# Patient Record
Sex: Female | Born: 1950 | Race: Black or African American | Hispanic: No | Marital: Single | Smoking: Never smoker
Health system: Southern US, Community
[De-identification: ages and names within clinical notes are randomized; demographics above are authoritative.]

## PROBLEM LIST (undated history)

## (undated) DIAGNOSIS — M1990 Unspecified osteoarthritis, unspecified site: Secondary | ICD-10-CM

## (undated) DIAGNOSIS — I10 Essential (primary) hypertension: Secondary | ICD-10-CM

## (undated) DIAGNOSIS — E785 Hyperlipidemia, unspecified: Secondary | ICD-10-CM

## (undated) DIAGNOSIS — G43909 Migraine, unspecified, not intractable, without status migrainosus: Secondary | ICD-10-CM

## (undated) DIAGNOSIS — E119 Type 2 diabetes mellitus without complications: Secondary | ICD-10-CM

## (undated) DIAGNOSIS — M810 Age-related osteoporosis without current pathological fracture: Secondary | ICD-10-CM

## (undated) HISTORY — DX: Age-related osteoporosis without current pathological fracture: M81.0

## (undated) HISTORY — DX: Unspecified osteoarthritis, unspecified site: M19.90

## (undated) HISTORY — PX: TUBAL LIGATION: SHX77

## (undated) HISTORY — DX: Migraine, unspecified, not intractable, without status migrainosus: G43.909

## (undated) HISTORY — DX: Type 2 diabetes mellitus without complications: E11.9

## (undated) HISTORY — PX: COLONOSCOPY: SHX174

## (undated) HISTORY — DX: Hyperlipidemia, unspecified: E78.5

## (undated) HISTORY — PX: KNEE ARTHROSCOPY: SUR90

## (undated) HISTORY — PX: SHOULDER ARTHROSCOPY: SHX128

## (undated) HISTORY — PX: ABDOMINAL HYSTERECTOMY: SHX81

## (undated) HISTORY — DX: Essential (primary) hypertension: I10

---

## 2007-08-28 ENCOUNTER — Ambulatory Visit: Payer: Self-pay | Admitting: Cardiology

## 2012-07-14 ENCOUNTER — Other Ambulatory Visit (HOSPITAL_COMMUNITY): Payer: Self-pay | Admitting: Nurse Practitioner

## 2012-07-14 DIAGNOSIS — Z139 Encounter for screening, unspecified: Secondary | ICD-10-CM

## 2012-07-18 ENCOUNTER — Ambulatory Visit (HOSPITAL_COMMUNITY)
Admission: RE | Admit: 2012-07-18 | Discharge: 2012-07-18 | Disposition: A | Discharge status: Home / Self Care | Payer: PRIVATE HEALTH INSURANCE | Source: Ambulatory Visit | Attending: Nurse Practitioner | Admitting: Nurse Practitioner

## 2012-07-18 DIAGNOSIS — Z1231 Encounter for screening mammogram for malignant neoplasm of breast: Secondary | ICD-10-CM | POA: Insufficient documentation

## 2012-07-18 DIAGNOSIS — Z139 Encounter for screening, unspecified: Secondary | ICD-10-CM

## 2013-09-11 ENCOUNTER — Other Ambulatory Visit (HOSPITAL_COMMUNITY): Payer: Self-pay | Admitting: Nurse Practitioner

## 2013-09-11 DIAGNOSIS — Z139 Encounter for screening, unspecified: Secondary | ICD-10-CM

## 2013-09-21 ENCOUNTER — Ambulatory Visit (HOSPITAL_COMMUNITY)
Admission: RE | Admit: 2013-09-21 | Discharge: 2013-09-21 | Disposition: A | Discharge status: Home / Self Care | Payer: BC Managed Care – PPO | Source: Ambulatory Visit | Attending: Nurse Practitioner | Admitting: Nurse Practitioner

## 2013-09-21 DIAGNOSIS — Z1231 Encounter for screening mammogram for malignant neoplasm of breast: Secondary | ICD-10-CM | POA: Insufficient documentation

## 2013-09-21 DIAGNOSIS — Z139 Encounter for screening, unspecified: Secondary | ICD-10-CM

## 2014-12-13 HISTORY — PX: COLONOSCOPY: SHX174

## 2016-01-09 DIAGNOSIS — E1142 Type 2 diabetes mellitus with diabetic polyneuropathy: Secondary | ICD-10-CM | POA: Diagnosis not present

## 2016-01-09 DIAGNOSIS — Z0000 Encounter for general adult medical examination without abnormal findings: Secondary | ICD-10-CM | POA: Diagnosis not present

## 2016-01-09 DIAGNOSIS — E782 Mixed hyperlipidemia: Secondary | ICD-10-CM | POA: Diagnosis not present

## 2016-01-09 DIAGNOSIS — I10 Essential (primary) hypertension: Secondary | ICD-10-CM | POA: Diagnosis not present

## 2016-01-16 DIAGNOSIS — Z1231 Encounter for screening mammogram for malignant neoplasm of breast: Secondary | ICD-10-CM | POA: Diagnosis not present

## 2016-02-10 DIAGNOSIS — K529 Noninfective gastroenteritis and colitis, unspecified: Secondary | ICD-10-CM | POA: Diagnosis not present

## 2016-02-10 DIAGNOSIS — R112 Nausea with vomiting, unspecified: Secondary | ICD-10-CM | POA: Diagnosis not present

## 2016-02-10 DIAGNOSIS — Z780 Asymptomatic menopausal state: Secondary | ICD-10-CM | POA: Diagnosis not present

## 2016-02-10 DIAGNOSIS — G629 Polyneuropathy, unspecified: Secondary | ICD-10-CM | POA: Diagnosis not present

## 2016-02-10 DIAGNOSIS — J209 Acute bronchitis, unspecified: Secondary | ICD-10-CM | POA: Diagnosis not present

## 2016-02-10 DIAGNOSIS — Z79899 Other long term (current) drug therapy: Secondary | ICD-10-CM | POA: Diagnosis not present

## 2016-02-10 DIAGNOSIS — E28319 Asymptomatic premature menopause: Secondary | ICD-10-CM | POA: Diagnosis not present

## 2016-02-10 DIAGNOSIS — Z8249 Family history of ischemic heart disease and other diseases of the circulatory system: Secondary | ICD-10-CM | POA: Diagnosis not present

## 2016-02-10 DIAGNOSIS — Z7984 Long term (current) use of oral hypoglycemic drugs: Secondary | ICD-10-CM | POA: Diagnosis not present

## 2016-02-10 DIAGNOSIS — Z794 Long term (current) use of insulin: Secondary | ICD-10-CM | POA: Diagnosis not present

## 2016-02-10 DIAGNOSIS — M8588 Other specified disorders of bone density and structure, other site: Secondary | ICD-10-CM | POA: Diagnosis not present

## 2016-02-10 DIAGNOSIS — M810 Age-related osteoporosis without current pathological fracture: Secondary | ICD-10-CM | POA: Diagnosis not present

## 2016-02-10 DIAGNOSIS — E7800 Pure hypercholesterolemia, unspecified: Secondary | ICD-10-CM | POA: Diagnosis not present

## 2016-02-10 DIAGNOSIS — I10 Essential (primary) hypertension: Secondary | ICD-10-CM | POA: Diagnosis not present

## 2016-02-10 DIAGNOSIS — G43909 Migraine, unspecified, not intractable, without status migrainosus: Secondary | ICD-10-CM | POA: Diagnosis not present

## 2016-02-10 DIAGNOSIS — J200 Acute bronchitis due to Mycoplasma pneumoniae: Secondary | ICD-10-CM | POA: Diagnosis not present

## 2016-02-10 DIAGNOSIS — M1990 Unspecified osteoarthritis, unspecified site: Secondary | ICD-10-CM | POA: Diagnosis not present

## 2016-02-10 DIAGNOSIS — E119 Type 2 diabetes mellitus without complications: Secondary | ICD-10-CM | POA: Diagnosis not present

## 2016-02-10 DIAGNOSIS — Z7982 Long term (current) use of aspirin: Secondary | ICD-10-CM | POA: Diagnosis not present

## 2016-02-10 DIAGNOSIS — R05 Cough: Secondary | ICD-10-CM | POA: Diagnosis not present

## 2016-02-15 DIAGNOSIS — Z780 Asymptomatic menopausal state: Secondary | ICD-10-CM | POA: Diagnosis not present

## 2016-02-15 DIAGNOSIS — I10 Essential (primary) hypertension: Secondary | ICD-10-CM | POA: Diagnosis not present

## 2016-02-15 DIAGNOSIS — E1140 Type 2 diabetes mellitus with diabetic neuropathy, unspecified: Secondary | ICD-10-CM | POA: Diagnosis not present

## 2016-02-15 DIAGNOSIS — J45998 Other asthma: Secondary | ICD-10-CM | POA: Diagnosis not present

## 2016-02-15 DIAGNOSIS — Z794 Long term (current) use of insulin: Secondary | ICD-10-CM | POA: Diagnosis not present

## 2016-02-15 DIAGNOSIS — N390 Urinary tract infection, site not specified: Secondary | ICD-10-CM | POA: Diagnosis not present

## 2016-02-15 DIAGNOSIS — J45909 Unspecified asthma, uncomplicated: Secondary | ICD-10-CM | POA: Diagnosis not present

## 2016-02-15 DIAGNOSIS — E1142 Type 2 diabetes mellitus with diabetic polyneuropathy: Secondary | ICD-10-CM | POA: Diagnosis not present

## 2016-02-15 DIAGNOSIS — J40 Bronchitis, not specified as acute or chronic: Secondary | ICD-10-CM | POA: Diagnosis not present

## 2016-02-15 DIAGNOSIS — E876 Hypokalemia: Secondary | ICD-10-CM | POA: Diagnosis not present

## 2016-02-15 DIAGNOSIS — Z7984 Long term (current) use of oral hypoglycemic drugs: Secondary | ICD-10-CM | POA: Diagnosis not present

## 2016-02-16 DIAGNOSIS — E876 Hypokalemia: Secondary | ICD-10-CM | POA: Diagnosis not present

## 2016-02-16 DIAGNOSIS — E1142 Type 2 diabetes mellitus with diabetic polyneuropathy: Secondary | ICD-10-CM | POA: Diagnosis not present

## 2016-02-16 DIAGNOSIS — I10 Essential (primary) hypertension: Secondary | ICD-10-CM | POA: Diagnosis not present

## 2016-02-16 DIAGNOSIS — N390 Urinary tract infection, site not specified: Secondary | ICD-10-CM | POA: Diagnosis not present

## 2016-02-16 DIAGNOSIS — J45998 Other asthma: Secondary | ICD-10-CM | POA: Diagnosis not present

## 2016-02-17 DIAGNOSIS — J209 Acute bronchitis, unspecified: Secondary | ICD-10-CM | POA: Diagnosis not present

## 2016-02-18 DIAGNOSIS — J45909 Unspecified asthma, uncomplicated: Secondary | ICD-10-CM | POA: Diagnosis not present

## 2016-02-18 DIAGNOSIS — E1142 Type 2 diabetes mellitus with diabetic polyneuropathy: Secondary | ICD-10-CM | POA: Diagnosis not present

## 2016-02-18 DIAGNOSIS — I10 Essential (primary) hypertension: Secondary | ICD-10-CM | POA: Diagnosis not present

## 2016-02-18 DIAGNOSIS — N390 Urinary tract infection, site not specified: Secondary | ICD-10-CM | POA: Diagnosis not present

## 2016-02-18 DIAGNOSIS — E876 Hypokalemia: Secondary | ICD-10-CM | POA: Diagnosis not present

## 2016-02-18 DIAGNOSIS — J45998 Other asthma: Secondary | ICD-10-CM | POA: Diagnosis not present

## 2016-03-05 DIAGNOSIS — J4521 Mild intermittent asthma with (acute) exacerbation: Secondary | ICD-10-CM | POA: Diagnosis not present

## 2016-03-19 DIAGNOSIS — J45909 Unspecified asthma, uncomplicated: Secondary | ICD-10-CM | POA: Diagnosis not present

## 2016-04-13 DIAGNOSIS — J4521 Mild intermittent asthma with (acute) exacerbation: Secondary | ICD-10-CM | POA: Diagnosis not present

## 2016-04-13 DIAGNOSIS — I10 Essential (primary) hypertension: Secondary | ICD-10-CM | POA: Diagnosis not present

## 2016-04-13 DIAGNOSIS — E119 Type 2 diabetes mellitus without complications: Secondary | ICD-10-CM | POA: Diagnosis not present

## 2016-04-19 DIAGNOSIS — J45909 Unspecified asthma, uncomplicated: Secondary | ICD-10-CM | POA: Diagnosis not present

## 2016-05-19 DIAGNOSIS — J45909 Unspecified asthma, uncomplicated: Secondary | ICD-10-CM | POA: Diagnosis not present

## 2016-06-19 DIAGNOSIS — J45909 Unspecified asthma, uncomplicated: Secondary | ICD-10-CM | POA: Diagnosis not present

## 2016-07-14 DIAGNOSIS — Z1389 Encounter for screening for other disorder: Secondary | ICD-10-CM | POA: Diagnosis not present

## 2016-07-14 DIAGNOSIS — M810 Age-related osteoporosis without current pathological fracture: Secondary | ICD-10-CM | POA: Diagnosis not present

## 2016-07-14 DIAGNOSIS — Z0000 Encounter for general adult medical examination without abnormal findings: Secondary | ICD-10-CM | POA: Diagnosis not present

## 2016-07-14 DIAGNOSIS — I10 Essential (primary) hypertension: Secondary | ICD-10-CM | POA: Diagnosis not present

## 2016-07-14 DIAGNOSIS — J4520 Mild intermittent asthma, uncomplicated: Secondary | ICD-10-CM | POA: Diagnosis not present

## 2016-07-14 DIAGNOSIS — K219 Gastro-esophageal reflux disease without esophagitis: Secondary | ICD-10-CM | POA: Diagnosis not present

## 2016-07-14 DIAGNOSIS — E784 Other hyperlipidemia: Secondary | ICD-10-CM | POA: Diagnosis not present

## 2016-07-20 DIAGNOSIS — J45909 Unspecified asthma, uncomplicated: Secondary | ICD-10-CM | POA: Diagnosis not present

## 2016-08-11 DIAGNOSIS — Z794 Long term (current) use of insulin: Secondary | ICD-10-CM | POA: Diagnosis not present

## 2016-08-11 DIAGNOSIS — H524 Presbyopia: Secondary | ICD-10-CM | POA: Diagnosis not present

## 2016-08-11 DIAGNOSIS — H43393 Other vitreous opacities, bilateral: Secondary | ICD-10-CM | POA: Diagnosis not present

## 2016-08-19 DIAGNOSIS — J45909 Unspecified asthma, uncomplicated: Secondary | ICD-10-CM | POA: Diagnosis not present

## 2016-09-19 DIAGNOSIS — J45909 Unspecified asthma, uncomplicated: Secondary | ICD-10-CM | POA: Diagnosis not present

## 2016-10-15 DIAGNOSIS — M810 Age-related osteoporosis without current pathological fracture: Secondary | ICD-10-CM | POA: Diagnosis not present

## 2016-10-15 DIAGNOSIS — E1165 Type 2 diabetes mellitus with hyperglycemia: Secondary | ICD-10-CM | POA: Diagnosis not present

## 2016-10-15 DIAGNOSIS — E784 Other hyperlipidemia: Secondary | ICD-10-CM | POA: Diagnosis not present

## 2016-10-15 DIAGNOSIS — E13621 Other specified diabetes mellitus with foot ulcer: Secondary | ICD-10-CM | POA: Diagnosis not present

## 2016-10-15 DIAGNOSIS — I10 Essential (primary) hypertension: Secondary | ICD-10-CM | POA: Diagnosis not present

## 2016-10-19 DIAGNOSIS — J45909 Unspecified asthma, uncomplicated: Secondary | ICD-10-CM | POA: Diagnosis not present

## 2016-11-19 DIAGNOSIS — J45909 Unspecified asthma, uncomplicated: Secondary | ICD-10-CM | POA: Diagnosis not present

## 2016-12-20 DIAGNOSIS — J45909 Unspecified asthma, uncomplicated: Secondary | ICD-10-CM | POA: Diagnosis not present

## 2017-01-18 DIAGNOSIS — M810 Age-related osteoporosis without current pathological fracture: Secondary | ICD-10-CM | POA: Diagnosis not present

## 2017-01-18 DIAGNOSIS — E13621 Other specified diabetes mellitus with foot ulcer: Secondary | ICD-10-CM | POA: Diagnosis not present

## 2017-01-18 DIAGNOSIS — E1165 Type 2 diabetes mellitus with hyperglycemia: Secondary | ICD-10-CM | POA: Diagnosis not present

## 2017-01-18 DIAGNOSIS — Z0000 Encounter for general adult medical examination without abnormal findings: Secondary | ICD-10-CM | POA: Diagnosis not present

## 2017-01-18 DIAGNOSIS — Z1389 Encounter for screening for other disorder: Secondary | ICD-10-CM | POA: Diagnosis not present

## 2017-01-18 DIAGNOSIS — E784 Other hyperlipidemia: Secondary | ICD-10-CM | POA: Diagnosis not present

## 2017-01-18 DIAGNOSIS — I10 Essential (primary) hypertension: Secondary | ICD-10-CM | POA: Diagnosis not present

## 2017-04-20 DIAGNOSIS — E1165 Type 2 diabetes mellitus with hyperglycemia: Secondary | ICD-10-CM | POA: Diagnosis not present

## 2017-04-20 DIAGNOSIS — I10 Essential (primary) hypertension: Secondary | ICD-10-CM | POA: Diagnosis not present

## 2017-04-20 DIAGNOSIS — Z0000 Encounter for general adult medical examination without abnormal findings: Secondary | ICD-10-CM | POA: Diagnosis not present

## 2017-04-20 DIAGNOSIS — J4520 Mild intermittent asthma, uncomplicated: Secondary | ICD-10-CM | POA: Diagnosis not present

## 2017-04-20 DIAGNOSIS — M810 Age-related osteoporosis without current pathological fracture: Secondary | ICD-10-CM | POA: Diagnosis not present

## 2017-04-20 DIAGNOSIS — E119 Type 2 diabetes mellitus without complications: Secondary | ICD-10-CM | POA: Diagnosis not present

## 2017-04-20 DIAGNOSIS — E784 Other hyperlipidemia: Secondary | ICD-10-CM | POA: Diagnosis not present

## 2017-07-26 DIAGNOSIS — M810 Age-related osteoporosis without current pathological fracture: Secondary | ICD-10-CM | POA: Diagnosis not present

## 2017-07-26 DIAGNOSIS — E119 Type 2 diabetes mellitus without complications: Secondary | ICD-10-CM | POA: Diagnosis not present

## 2017-07-26 DIAGNOSIS — J4520 Mild intermittent asthma, uncomplicated: Secondary | ICD-10-CM | POA: Diagnosis not present

## 2017-07-26 DIAGNOSIS — E784 Other hyperlipidemia: Secondary | ICD-10-CM | POA: Diagnosis not present

## 2017-07-26 DIAGNOSIS — I10 Essential (primary) hypertension: Secondary | ICD-10-CM | POA: Diagnosis not present

## 2017-08-25 DIAGNOSIS — H43393 Other vitreous opacities, bilateral: Secondary | ICD-10-CM | POA: Diagnosis not present

## 2017-08-25 DIAGNOSIS — H25013 Cortical age-related cataract, bilateral: Secondary | ICD-10-CM | POA: Diagnosis not present

## 2017-08-25 DIAGNOSIS — H5213 Myopia, bilateral: Secondary | ICD-10-CM | POA: Diagnosis not present

## 2017-10-26 DIAGNOSIS — J4520 Mild intermittent asthma, uncomplicated: Secondary | ICD-10-CM | POA: Diagnosis not present

## 2017-10-26 DIAGNOSIS — K219 Gastro-esophageal reflux disease without esophagitis: Secondary | ICD-10-CM | POA: Diagnosis not present

## 2017-10-26 DIAGNOSIS — E119 Type 2 diabetes mellitus without complications: Secondary | ICD-10-CM | POA: Diagnosis not present

## 2017-10-26 DIAGNOSIS — M810 Age-related osteoporosis without current pathological fracture: Secondary | ICD-10-CM | POA: Diagnosis not present

## 2017-10-26 DIAGNOSIS — I10 Essential (primary) hypertension: Secondary | ICD-10-CM | POA: Diagnosis not present

## 2017-10-27 DIAGNOSIS — J4520 Mild intermittent asthma, uncomplicated: Secondary | ICD-10-CM | POA: Diagnosis not present

## 2017-10-27 DIAGNOSIS — E119 Type 2 diabetes mellitus without complications: Secondary | ICD-10-CM | POA: Diagnosis not present

## 2017-10-27 DIAGNOSIS — M810 Age-related osteoporosis without current pathological fracture: Secondary | ICD-10-CM | POA: Diagnosis not present

## 2017-10-27 DIAGNOSIS — K219 Gastro-esophageal reflux disease without esophagitis: Secondary | ICD-10-CM | POA: Diagnosis not present

## 2018-01-26 DIAGNOSIS — K219 Gastro-esophageal reflux disease without esophagitis: Secondary | ICD-10-CM | POA: Diagnosis not present

## 2018-01-26 DIAGNOSIS — J4520 Mild intermittent asthma, uncomplicated: Secondary | ICD-10-CM | POA: Diagnosis not present

## 2018-01-26 DIAGNOSIS — E119 Type 2 diabetes mellitus without complications: Secondary | ICD-10-CM | POA: Diagnosis not present

## 2018-01-26 DIAGNOSIS — M810 Age-related osteoporosis without current pathological fracture: Secondary | ICD-10-CM | POA: Diagnosis not present

## 2018-01-26 DIAGNOSIS — I10 Essential (primary) hypertension: Secondary | ICD-10-CM | POA: Diagnosis not present

## 2018-05-04 DIAGNOSIS — Z1389 Encounter for screening for other disorder: Secondary | ICD-10-CM | POA: Diagnosis not present

## 2018-05-04 DIAGNOSIS — M810 Age-related osteoporosis without current pathological fracture: Secondary | ICD-10-CM | POA: Diagnosis not present

## 2018-05-04 DIAGNOSIS — J4520 Mild intermittent asthma, uncomplicated: Secondary | ICD-10-CM | POA: Diagnosis not present

## 2018-05-04 DIAGNOSIS — E7849 Other hyperlipidemia: Secondary | ICD-10-CM | POA: Diagnosis not present

## 2018-05-04 DIAGNOSIS — E119 Type 2 diabetes mellitus without complications: Secondary | ICD-10-CM | POA: Diagnosis not present

## 2018-05-04 DIAGNOSIS — K219 Gastro-esophageal reflux disease without esophagitis: Secondary | ICD-10-CM | POA: Diagnosis not present

## 2018-05-04 DIAGNOSIS — Z0000 Encounter for general adult medical examination without abnormal findings: Secondary | ICD-10-CM | POA: Diagnosis not present

## 2018-05-04 DIAGNOSIS — I10 Essential (primary) hypertension: Secondary | ICD-10-CM | POA: Diagnosis not present

## 2018-07-19 ENCOUNTER — Other Ambulatory Visit: Payer: Self-pay

## 2018-07-19 NOTE — Patient Outreach (Signed)
[facility]) Care Management  07/19/2018  Vanessa Pierce 05-06-1951 [id number]   Medication Adherence call to [name] left a message for patient to call back patient is due on Lisinopril/Hctz 20/25 and Pravastatin 80 mg. [name] is showing past due under [place].  [place] Management Direct Dial [phone]  Fax [phone] [email or web address]

## 2018-08-11 DIAGNOSIS — I10 Essential (primary) hypertension: Secondary | ICD-10-CM | POA: Diagnosis not present

## 2018-08-11 DIAGNOSIS — M810 Age-related osteoporosis without current pathological fracture: Secondary | ICD-10-CM | POA: Diagnosis not present

## 2018-08-11 DIAGNOSIS — J4520 Mild intermittent asthma, uncomplicated: Secondary | ICD-10-CM | POA: Diagnosis not present

## 2018-08-11 DIAGNOSIS — E119 Type 2 diabetes mellitus without complications: Secondary | ICD-10-CM | POA: Diagnosis not present

## 2018-08-11 DIAGNOSIS — K219 Gastro-esophageal reflux disease without esophagitis: Secondary | ICD-10-CM | POA: Diagnosis not present

## 2018-08-25 DIAGNOSIS — Z1231 Encounter for screening mammogram for malignant neoplasm of breast: Secondary | ICD-10-CM | POA: Diagnosis not present

## 2018-11-16 DIAGNOSIS — K219 Gastro-esophageal reflux disease without esophagitis: Secondary | ICD-10-CM | POA: Diagnosis not present

## 2018-11-16 DIAGNOSIS — M810 Age-related osteoporosis without current pathological fracture: Secondary | ICD-10-CM | POA: Diagnosis not present

## 2018-11-16 DIAGNOSIS — Z0000 Encounter for general adult medical examination without abnormal findings: Secondary | ICD-10-CM | POA: Diagnosis not present

## 2018-11-16 DIAGNOSIS — J4520 Mild intermittent asthma, uncomplicated: Secondary | ICD-10-CM | POA: Diagnosis not present

## 2018-11-16 DIAGNOSIS — I10 Essential (primary) hypertension: Secondary | ICD-10-CM | POA: Diagnosis not present

## 2018-11-16 DIAGNOSIS — E7849 Other hyperlipidemia: Secondary | ICD-10-CM | POA: Diagnosis not present

## 2018-11-16 DIAGNOSIS — E119 Type 2 diabetes mellitus without complications: Secondary | ICD-10-CM | POA: Diagnosis not present

## 2018-12-08 DIAGNOSIS — M810 Age-related osteoporosis without current pathological fracture: Secondary | ICD-10-CM | POA: Diagnosis not present

## 2019-02-22 DIAGNOSIS — I10 Essential (primary) hypertension: Secondary | ICD-10-CM | POA: Diagnosis not present

## 2019-02-22 DIAGNOSIS — E7849 Other hyperlipidemia: Secondary | ICD-10-CM | POA: Diagnosis not present

## 2019-02-22 DIAGNOSIS — E119 Type 2 diabetes mellitus without complications: Secondary | ICD-10-CM | POA: Diagnosis not present

## 2019-02-22 DIAGNOSIS — M810 Age-related osteoporosis without current pathological fracture: Secondary | ICD-10-CM | POA: Diagnosis not present

## 2019-02-22 DIAGNOSIS — K219 Gastro-esophageal reflux disease without esophagitis: Secondary | ICD-10-CM | POA: Diagnosis not present

## 2019-05-29 DIAGNOSIS — M810 Age-related osteoporosis without current pathological fracture: Secondary | ICD-10-CM | POA: Diagnosis not present

## 2019-05-29 DIAGNOSIS — Z1389 Encounter for screening for other disorder: Secondary | ICD-10-CM | POA: Diagnosis not present

## 2019-05-29 DIAGNOSIS — E7849 Other hyperlipidemia: Secondary | ICD-10-CM | POA: Diagnosis not present

## 2019-05-29 DIAGNOSIS — K219 Gastro-esophageal reflux disease without esophagitis: Secondary | ICD-10-CM | POA: Diagnosis not present

## 2019-05-29 DIAGNOSIS — I10 Essential (primary) hypertension: Secondary | ICD-10-CM | POA: Diagnosis not present

## 2019-05-29 DIAGNOSIS — Z0000 Encounter for general adult medical examination without abnormal findings: Secondary | ICD-10-CM | POA: Diagnosis not present

## 2019-05-29 DIAGNOSIS — E119 Type 2 diabetes mellitus without complications: Secondary | ICD-10-CM | POA: Diagnosis not present

## 2019-07-11 DIAGNOSIS — E11319 Type 2 diabetes mellitus with unspecified diabetic retinopathy without macular edema: Secondary | ICD-10-CM | POA: Diagnosis not present

## 2019-08-11 DIAGNOSIS — E1142 Type 2 diabetes mellitus with diabetic polyneuropathy: Secondary | ICD-10-CM | POA: Diagnosis not present

## 2019-08-11 DIAGNOSIS — E7849 Other hyperlipidemia: Secondary | ICD-10-CM | POA: Diagnosis not present

## 2019-08-11 DIAGNOSIS — M810 Age-related osteoporosis without current pathological fracture: Secondary | ICD-10-CM | POA: Diagnosis not present

## 2019-08-11 DIAGNOSIS — I10 Essential (primary) hypertension: Secondary | ICD-10-CM | POA: Diagnosis not present

## 2019-08-11 DIAGNOSIS — K219 Gastro-esophageal reflux disease without esophagitis: Secondary | ICD-10-CM | POA: Diagnosis not present

## 2019-08-14 ENCOUNTER — Other Ambulatory Visit: Payer: Self-pay

## 2019-08-14 NOTE — Patient Outreach (Signed)
[facility]) Care Management  08/14/2019  Vanessa Pierce 1951/06/06 [id number]   Medication Adherence call to Vanessa Pierce HIPPA Compliant Voice message left with a call back number.' Vanessa Pierce is showing past due on Pravastatin 80 mg and Lisinopril/Hctz 20/25 mg under Vanessa Pierce.   Vanessa Pierce Management Direct Dial [phone]  Fax [phone] [email or web address]

## 2019-08-21 DIAGNOSIS — N952 Postmenopausal atrophic vaginitis: Secondary | ICD-10-CM | POA: Diagnosis not present

## 2019-08-30 DIAGNOSIS — E1142 Type 2 diabetes mellitus with diabetic polyneuropathy: Secondary | ICD-10-CM | POA: Diagnosis not present

## 2019-08-30 DIAGNOSIS — I10 Essential (primary) hypertension: Secondary | ICD-10-CM | POA: Diagnosis not present

## 2019-08-30 DIAGNOSIS — K219 Gastro-esophageal reflux disease without esophagitis: Secondary | ICD-10-CM | POA: Diagnosis not present

## 2019-08-30 DIAGNOSIS — M810 Age-related osteoporosis without current pathological fracture: Secondary | ICD-10-CM | POA: Diagnosis not present

## 2019-08-30 DIAGNOSIS — E7849 Other hyperlipidemia: Secondary | ICD-10-CM | POA: Diagnosis not present

## 2019-10-09 DIAGNOSIS — L309 Dermatitis, unspecified: Secondary | ICD-10-CM | POA: Diagnosis not present

## 2019-11-30 DIAGNOSIS — I10 Essential (primary) hypertension: Secondary | ICD-10-CM | POA: Diagnosis not present

## 2019-11-30 DIAGNOSIS — Z1389 Encounter for screening for other disorder: Secondary | ICD-10-CM | POA: Diagnosis not present

## 2019-11-30 DIAGNOSIS — M810 Age-related osteoporosis without current pathological fracture: Secondary | ICD-10-CM | POA: Diagnosis not present

## 2019-11-30 DIAGNOSIS — E1142 Type 2 diabetes mellitus with diabetic polyneuropathy: Secondary | ICD-10-CM | POA: Diagnosis not present

## 2019-11-30 DIAGNOSIS — E7849 Other hyperlipidemia: Secondary | ICD-10-CM | POA: Diagnosis not present

## 2019-11-30 DIAGNOSIS — L309 Dermatitis, unspecified: Secondary | ICD-10-CM | POA: Diagnosis not present

## 2019-11-30 DIAGNOSIS — Z0000 Encounter for general adult medical examination without abnormal findings: Secondary | ICD-10-CM | POA: Diagnosis not present

## 2020-03-07 DIAGNOSIS — E7849 Other hyperlipidemia: Secondary | ICD-10-CM | POA: Diagnosis not present

## 2020-03-07 DIAGNOSIS — E1142 Type 2 diabetes mellitus with diabetic polyneuropathy: Secondary | ICD-10-CM | POA: Diagnosis not present

## 2020-03-07 DIAGNOSIS — M810 Age-related osteoporosis without current pathological fracture: Secondary | ICD-10-CM | POA: Diagnosis not present

## 2020-03-07 DIAGNOSIS — K219 Gastro-esophageal reflux disease without esophagitis: Secondary | ICD-10-CM | POA: Diagnosis not present

## 2020-03-07 DIAGNOSIS — I10 Essential (primary) hypertension: Secondary | ICD-10-CM | POA: Diagnosis not present

## 2020-06-03 DIAGNOSIS — E7849 Other hyperlipidemia: Secondary | ICD-10-CM | POA: Diagnosis not present

## 2020-06-03 DIAGNOSIS — I10 Essential (primary) hypertension: Secondary | ICD-10-CM | POA: Diagnosis not present

## 2020-06-03 DIAGNOSIS — E1142 Type 2 diabetes mellitus with diabetic polyneuropathy: Secondary | ICD-10-CM | POA: Diagnosis not present

## 2020-06-03 DIAGNOSIS — Z0000 Encounter for general adult medical examination without abnormal findings: Secondary | ICD-10-CM | POA: Diagnosis not present

## 2020-06-03 DIAGNOSIS — M179 Osteoarthritis of knee, unspecified: Secondary | ICD-10-CM | POA: Diagnosis not present

## 2020-06-03 DIAGNOSIS — K219 Gastro-esophageal reflux disease without esophagitis: Secondary | ICD-10-CM | POA: Diagnosis not present

## 2020-06-07 DIAGNOSIS — E1142 Type 2 diabetes mellitus with diabetic polyneuropathy: Secondary | ICD-10-CM | POA: Diagnosis not present

## 2020-06-07 DIAGNOSIS — I10 Essential (primary) hypertension: Secondary | ICD-10-CM | POA: Diagnosis not present

## 2020-06-07 DIAGNOSIS — Z0000 Encounter for general adult medical examination without abnormal findings: Secondary | ICD-10-CM | POA: Diagnosis not present

## 2020-06-07 DIAGNOSIS — M179 Osteoarthritis of knee, unspecified: Secondary | ICD-10-CM | POA: Diagnosis not present

## 2020-06-07 DIAGNOSIS — E7849 Other hyperlipidemia: Secondary | ICD-10-CM | POA: Diagnosis not present

## 2020-07-04 ENCOUNTER — Ambulatory Visit: Payer: Self-pay

## 2020-07-04 ENCOUNTER — Ambulatory Visit (INDEPENDENT_AMBULATORY_CARE_PROVIDER_SITE_OTHER): Payer: Medicare Other | Admitting: Orthopaedic Surgery

## 2020-07-04 ENCOUNTER — Encounter: Payer: Self-pay | Admitting: Orthopaedic Surgery

## 2020-07-04 ENCOUNTER — Other Ambulatory Visit: Payer: Self-pay

## 2020-07-04 VITALS — BP 121/80 | HR 79 | Ht 64.0 in | Wt 175.0 lb

## 2020-07-04 DIAGNOSIS — G8929 Other chronic pain: Secondary | ICD-10-CM | POA: Diagnosis not present

## 2020-07-04 DIAGNOSIS — M25562 Pain in left knee: Secondary | ICD-10-CM

## 2020-07-04 DIAGNOSIS — M25561 Pain in right knee: Secondary | ICD-10-CM | POA: Diagnosis not present

## 2020-07-04 DIAGNOSIS — M170 Bilateral primary osteoarthritis of knee: Secondary | ICD-10-CM

## 2020-07-04 NOTE — Progress Notes (Signed)
Office Visit Note   Patient: Vanessa Pierce           Date of Birth: 05-23-1951           MRN: [id number] Visit Date: 07/04/2020              Requested by: Arman Filter, [place],  [place] [address] PCP: Neale Burly, MD   Assessment & Plan: Visit Diagnoses:  1. Bilateral chronic knee pain   2. Bilateral primary osteoarthritis of knee     Plan: Left knee bothers her little bit more than right knee she states she like to proceed with left total knee arthroplasty once the hospital resumes elective surgery.  Plan procedure discussed risk surgery discussed overnight stay, home therapy for 1 to 2 weeks followed by outpatient therapy.  Questions elicited and answered she request to proceed.  Follow-Up Instructions: No follow-ups on file.   Orders:  Orders Placed This Encounter  Procedures  . XR Knee 1-2 Views Right  . XR KNEE 3 VIEW LEFT   No orders of the defined types were placed in this encounter.     Procedures: No procedures performed   Clinical Data: No additional findings.   Subjective: Chief Complaint  Patient presents with  . Right Knee - Pain  . Left Knee - Pain    HPI 69 year old female referred by Dr.Hasanaj with progressive bilateral knee osteoarthritis not responsive to anti-inflammatories and recent injections.  Patient is diabetic she is on insulin she states her last A1c was 7.0.  She does have osteoporosis for which she takes Fosamax.  She has been amatory with a cane with progressive knee swelling pain and has not had any recent falls in the last 2 months but did fall prior to that due to knee catching and giving way.  Both knees burn and she has more swelling with the left than right knee.  Dr.Hasanaj had referred her here for left total knee arthroplasty.  She did have previous arthroscopy right knee about 20 years ago with slight improvement.  Previous hysterectomy tubal ligation shoulder surgery without anesthetic  problems.  Patient is single, retired lives with her brother does not smoke rarely drinks.  Review of Systems positive for diabetes hypertension migraines as above.  Negative for CVA heart attack.   Objective: Vital Signs: BP 121/80   Pulse 79   Ht 5\' 4"  (1.626 m)   Wt 175 lb (79.4 kg)   BMI 30.04 kg/m   Physical Exam Constitutional:      Appearance: She is well-developed.  HENT:     Head: Normocephalic.     Right Ear: External ear normal.     Left Ear: External ear normal.  Eyes:     Pupils: Pupils are equal, round, and reactive to light.  Neck:     Thyroid: No thyromegaly.     Trachea: No tracheal deviation.  Cardiovascular:     Rate and Rhythm: Normal rate.  Pulmonary:     Effort: Pulmonary effort is normal.  Abdominal:     Palpations: Abdomen is soft.  Skin:    General: Skin is warm and dry.  Neurological:     Mental Status: She is alert and oriented to person, place, and time.  Psychiatric:        Behavior: Behavior normal.     Ortho Exam patient has bilateral crepitus knees come to near full extension medial lateral palpable osteophytes.  No severe  varus valgus or varus deformity.  Distal pulses are intact normal logroll the hips ankle range of motion is normal.  Good gastrocs anterior tib strength.  Specialty Comments:  No specialty comments available.  Imaging: XR Knee 1-2 Views Right  Result Date: 07/04/2020 Standing AP both knees lateral right knee and sunrise patella view demonstrates tricompartmental degenerative arthritis with loss of joint space marginal osteophytes and subchondral sclerosis. Impression: Moderate to severe right knee osteoarthritis.  XR KNEE 3 VIEW LEFT  Result Date: 07/04/2020 Standing AP both knees lateral left knee and sunrise patellar x-ray demonstrate tricompartmental degenerative arthritis slightly worse left than right with more spurring laterally.  Subchondral sclerosis and some posterior loose bodies are noted. Impression:  Moderate to severe left knee primary osteoarthritis.    PMFS History: Patient Active Problem List   Diagnosis Date Noted  . Bilateral primary osteoarthritis of knee 07/04/2020   Past Medical History:  Diagnosis Date  . Arthritis   . Diabetes mellitus without complication ([place])   . Hypertension   . Migraines   . Osteoporosis     No family history on file.  Past Surgical History:  Procedure Laterality Date  . ABDOMINAL HYSTERECTOMY    . KNEE ARTHROSCOPY Right    Social History   Occupational History  . Not on file  Tobacco Use  . Smoking status: Never Smoker  . Smokeless tobacco: Never Used  Substance and Sexual Activity  . Alcohol use: Yes    Comment: occasionally  . Drug use: Not on file  . Sexual activity: Not on file

## 2020-08-26 ENCOUNTER — Other Ambulatory Visit: Payer: Self-pay

## 2020-09-04 DIAGNOSIS — I10 Essential (primary) hypertension: Secondary | ICD-10-CM | POA: Diagnosis not present

## 2020-09-04 DIAGNOSIS — E1142 Type 2 diabetes mellitus with diabetic polyneuropathy: Secondary | ICD-10-CM | POA: Diagnosis not present

## 2020-09-04 DIAGNOSIS — Z0000 Encounter for general adult medical examination without abnormal findings: Secondary | ICD-10-CM | POA: Diagnosis not present

## 2020-09-04 DIAGNOSIS — E7849 Other hyperlipidemia: Secondary | ICD-10-CM | POA: Diagnosis not present

## 2020-09-04 DIAGNOSIS — M179 Osteoarthritis of knee, unspecified: Secondary | ICD-10-CM | POA: Diagnosis not present

## 2020-09-04 DIAGNOSIS — M810 Age-related osteoporosis without current pathological fracture: Secondary | ICD-10-CM | POA: Diagnosis not present

## 2020-09-18 ENCOUNTER — Encounter: Payer: Self-pay | Admitting: Surgery

## 2020-09-18 ENCOUNTER — Ambulatory Visit (INDEPENDENT_AMBULATORY_CARE_PROVIDER_SITE_OTHER): Payer: Medicare Other | Admitting: Surgery

## 2020-09-18 VITALS — BP 109/70 | HR 88 | Ht 63.25 in | Wt 178.2 lb

## 2020-09-18 DIAGNOSIS — M1712 Unilateral primary osteoarthritis, left knee: Secondary | ICD-10-CM

## 2020-09-18 NOTE — Progress Notes (Signed)
69 year old black female history of end-stage DJD left knee and pain comes in for preop evaluation.  Knee symptoms unchanged from previous visit.  She is want to proceed with left total knee replacement as scheduled.  We are awaiting preop clearance from her primary care physician.  Today history and physical performed.  Review of systems negative.  Surgical procedure discussed along potential hospital stay.  All questions answered and she wishes to proceed.

## 2020-09-23 NOTE — Progress Notes (Signed)
[place], [place] [address] Phone: [phone] Fax: [phone]      Your procedure is scheduled on November 19  Report to [facility] Main Entrance "A" at 0530 A.M., and check in at the Admitting office.  Call this number if you have problems the morning of surgery:  [phone]  Call [phone] if you have any questions prior to your surgery date Monday-Friday 8am-4pm    Remember:  Do not eat after midnight the night before your surgery  You may drink clear liquids until 0430 am the morning of your surgery.   Clear liquids allowed are: Water, Non-Citrus Juices (without pulp), Carbonated Beverages, Clear Tea, Black Coffee Only, and Gatorade   Enhanced Recovery after Surgery for Orthopedics Enhanced Recovery after Surgery is a protocol used to improve the stress on your body and your recovery after surgery.  Patient Instructions  . The night before surgery:  o No food after midnight. ONLY clear liquids after midnight  . The day of surgery (if you have diabetes): o  o Drink ONE (1) Small Bottle of water by _0430 am____ the morning of surgery o This drink was given to you during your hospital  pre-op appointment visit.          If you have questions, please contact your surgeon's office.     Take these medicines the morning of surgery with A SIP OF WATER  amLODipine (NORVASC)  gabapentin (NEURONTIN) topiramate (TOPAMAX)  As of today, STOP taking any Aspirin (unless otherwise instructed by your surgeon) Aleve, Naproxen, Ibuprofen, Motrin, Advil, Goody's, BC's, all herbal medications, fish oil, and all vitamins.   WHAT DO I DO ABOUT MY DIABETES MEDICATION?  DO not take evening dose of glipiZIDE (GLUCOTROL)  Or insulin regular (NOVOLIN R) the night before surgery  . Do not take oral diabetes medicines (pills) the morning of surgery. glipiZIDE (GLUCOTROL) and metFORMIN (GLUCOPHAGE)  . THE NIGHT  BEFORE SURGERY, take _____10______ units of _insulin NPH Human (NOVOLIN N)__________insulin.       . THE MORNING OF SURGERY, take _______10______ units of __insulin NPH Human (NOVOLIN N)________insulin.  . If your CBG is greater than 220 mg/dL, you may take  of your sliding scale (correction) dose of insulin. insulin regular (NOVOLIN R)   HOW TO MANAGE YOUR DIABETES BEFORE AND AFTER SURGERY  Why is it important to control my blood sugar before and after surgery? . Improving blood sugar levels before and after surgery helps healing and can limit problems. . A way of improving blood sugar control is eating a healthy diet by: o  Eating less sugar and carbohydrates o  Increasing activity/exercise o  Talking with your doctor about reaching your blood sugar goals . High blood sugars (greater than 180 mg/dL) can raise your risk of infections and slow your recovery, so you will need to focus on controlling your diabetes during the weeks before surgery. . Make sure that the doctor who takes care of your diabetes knows about your planned surgery including the date and location.  How do I manage my blood sugar before surgery? . Check your blood sugar at least 4 times a day, starting 2 days before surgery, to make sure that the level is not too high or low. . Check your blood sugar the morning of your surgery when you wake up and every 2 hours until you get to the Short Stay unit. o If your  blood sugar is less than 70 mg/dL, you will need to treat for low blood sugar: - Do not take insulin. - Treat a low blood sugar (less than 70 mg/dL) with  cup of clear juice (cranberry or apple), 4 glucose tablets, OR glucose gel. - Recheck blood sugar in 15 minutes after treatment (to make sure it is greater than 70 mg/dL). If your blood sugar is not greater than 70 mg/dL on recheck, call [phone] for further instructions. . Report your blood sugar to the short stay nurse when you get to Short Stay.  . If  you are admitted to the hospital after surgery: o Your blood sugar will be checked by the staff and you will probably be given insulin after surgery (instead of oral diabetes medicines) to make sure you have good blood sugar levels. o The goal for blood sugar control after surgery is 80-180 mg/dL.                     Do not wear jewelry, make up, or nail polish            Do not wear lotions, powders, perfumes, or deodorant.            Do not shave 48 hours prior to surgery.              Do not bring valuables to the hospital.            [facility] is not responsible for any belongings or valuables.  Do NOT Smoke (Tobacco/Vaping) or drink Alcohol 24 hours prior to your procedure If you use a CPAP at night, you may bring all equipment for your overnight stay.   Contacts, glasses, dentures or bridgework may not be worn into surgery.      For patients admitted to the hospital, discharge time will be determined by your treatment team.   Patients discharged the day of surgery will not be allowed to drive home, and someone needs to stay with them for 24 hours.    Special instructions:   [place]- Preparing For Surgery  Before surgery, you can play an important role. Because skin is not sterile, your skin needs to be as free of germs as possible. You can reduce the number of germs on your skin by washing with CHG (chlorahexidine gluconate) Soap before surgery.  CHG is an antiseptic cleaner which kills germs and bonds with the skin to continue killing germs even after washing.    Oral Hygiene is also important to reduce your risk of infection.  Remember - BRUSH YOUR TEETH THE MORNING OF SURGERY WITH YOUR REGULAR TOOTHPASTE  Please do not use if you have an allergy to CHG or antibacterial soaps. If your skin becomes reddened/irritated stop using the CHG.  Do not shave (including legs and underarms) for at least 48 hours prior to first CHG shower. It is OK to shave your face.  Please follow  these instructions carefully.   1. Shower the NIGHT BEFORE SURGERY and the MORNING OF SURGERY with CHG Soap.   2. If you chose to wash your hair, wash your hair first as usual with your normal shampoo.  3. After you shampoo, rinse your hair and body thoroughly to remove the shampoo.  4. Use CHG as you would any other liquid soap. You can apply CHG directly to the skin and wash gently with a scrungie or a clean washcloth.   5. Apply the CHG Soap to your  body ONLY FROM THE NECK DOWN.  Do not use on open wounds or open sores. Avoid contact with your eyes, ears, mouth and genitals (private parts). Wash Face and genitals (private parts)  with your normal soap.   6. Wash thoroughly, paying special attention to the area where your surgery will be performed.  7. Thoroughly rinse your body with warm water from the neck down.  8. DO NOT shower/wash with your normal soap after using and rinsing off the CHG Soap.  9. Pat yourself dry with a CLEAN TOWEL.  10. Wear CLEAN PAJAMAS to bed the night before surgery  11. Place CLEAN SHEETS on your bed the night of your first shower and DO NOT SLEEP WITH PETS.   Day of Surgery: Wear Clean/Comfortable clothing the morning of surgery Do not apply any deodorants/lotions.   Remember to brush your teeth WITH YOUR REGULAR TOOTHPASTE.   Please read over the following fact sheets that you were given.

## 2020-09-24 ENCOUNTER — Other Ambulatory Visit (HOSPITAL_COMMUNITY)
Admission: RE | Admit: 2020-09-24 | Discharge: 2020-09-24 | Disposition: A | Discharge status: Home / Self Care | Payer: Medicare Other | Source: Ambulatory Visit | Attending: Orthopaedic Surgery | Admitting: Orthopaedic Surgery

## 2020-09-24 ENCOUNTER — Encounter (HOSPITAL_COMMUNITY): Payer: Self-pay

## 2020-09-24 ENCOUNTER — Other Ambulatory Visit: Payer: Self-pay

## 2020-09-24 ENCOUNTER — Encounter (HOSPITAL_COMMUNITY)
Admission: RE | Admit: 2020-09-24 | Discharge: 2020-09-24 | Disposition: A | Discharge status: Home / Self Care | Payer: Medicare Other | Source: Ambulatory Visit | Attending: Orthopaedic Surgery | Admitting: Orthopaedic Surgery

## 2020-09-24 DIAGNOSIS — I10 Essential (primary) hypertension: Secondary | ICD-10-CM | POA: Insufficient documentation

## 2020-09-24 DIAGNOSIS — M1712 Unilateral primary osteoarthritis, left knee: Secondary | ICD-10-CM | POA: Diagnosis not present

## 2020-09-24 DIAGNOSIS — Z96651 Presence of right artificial knee joint: Secondary | ICD-10-CM | POA: Insufficient documentation

## 2020-09-24 DIAGNOSIS — Z01812 Encounter for preprocedural laboratory examination: Secondary | ICD-10-CM | POA: Insufficient documentation

## 2020-09-24 DIAGNOSIS — Z01818 Encounter for other preprocedural examination: Secondary | ICD-10-CM | POA: Insufficient documentation

## 2020-09-24 DIAGNOSIS — E119 Type 2 diabetes mellitus without complications: Secondary | ICD-10-CM | POA: Insufficient documentation

## 2020-09-24 DIAGNOSIS — Z20822 Contact with and (suspected) exposure to covid-19: Secondary | ICD-10-CM | POA: Insufficient documentation

## 2020-09-24 LAB — URINALYSIS, ROUTINE W REFLEX MICROSCOPIC
Bacteria, UA: NONE SEEN
Bilirubin Urine: NEGATIVE
Glucose, UA: NEGATIVE mg/dL
Hgb urine dipstick: NEGATIVE
Ketones, ur: NEGATIVE mg/dL
Leukocytes,Ua: NEGATIVE
Nitrite: NEGATIVE
Protein, ur: NEGATIVE mg/dL
Specific Gravity, Urine: 1.012 (ref 1.005–1.030)
pH: 7 (ref 5.0–8.0)

## 2020-09-24 LAB — COMPREHENSIVE METABOLIC PANEL
ALT: 19 U/L (ref 0–44)
AST: 23 U/L (ref 15–41)
Albumin: 4.2 g/dL (ref 3.5–5.0)
Alkaline Phosphatase: 83 U/L (ref 38–126)
Anion gap: 13 (ref 5–15)
BUN: 10 mg/dL (ref 8–23)
CO2: 27 mmol/L (ref 22–32)
Calcium: 9.9 mg/dL (ref 8.9–10.3)
Chloride: 105 mmol/L (ref 98–111)
Creatinine, Ser: 1.15 mg/dL — ABNORMAL HIGH (ref 0.44–1.00)
GFR, Estimated: 52 mL/min — ABNORMAL LOW (ref >60)
Glucose, Bld: 99 mg/dL (ref 70–99)
Potassium: 3.7 mmol/L (ref 3.5–5.1)
Sodium: 145 mmol/L (ref 135–145)
Total Bilirubin: 0.4 mg/dL (ref 0.3–1.2)
Total Protein: 7.5 g/dL (ref 6.5–8.1)

## 2020-09-24 LAB — SURGICAL PCR SCREEN
MRSA, PCR: NEGATIVE
Staphylococcus aureus: POSITIVE — AB

## 2020-09-24 LAB — CBC
HCT: 44.2 % (ref 36.0–46.0)
Hemoglobin: 14.4 g/dL (ref 12.0–15.0)
MCH: 28.3 pg (ref 26.0–34.0)
MCHC: 32.6 g/dL (ref 30.0–36.0)
MCV: 86.8 fL (ref 80.0–100.0)
Platelets: 368 10*3/uL (ref 150–400)
RBC: 5.09 MIL/uL (ref 3.87–5.11)
RDW: 12.9 % (ref 11.5–15.5)
WBC: 6 10*3/uL (ref 4.0–10.5)
nRBC: 0 % (ref 0.0–0.2)

## 2020-09-24 LAB — SARS CORONAVIRUS 2 (TAT 6-24 HRS): SARS Coronavirus 2: NEGATIVE

## 2020-09-24 LAB — GLUCOSE, CAPILLARY: Glucose-Capillary: 127 mg/dL — ABNORMAL HIGH (ref 70–99)

## 2020-09-24 NOTE — Progress Notes (Addendum)
PCP - Dr. Stoney Bang Cardiologist - Denies  Chest x-ray - Not indicated EKG - 09/24/20 Stress Test - Been years ago  ECHO - Denies Cardiac Cath - denies  Sleep Study - Denies  DM - Type II  Fasting Blood Sugar - CBG this am 84 at home  CBG at PAT appt 127  Checks Blood Sugar ___2__ times a day  Blood Thinner Instructions:Denies Aspirin Instructions:Last dose of aspirin was 09/23/20  ERAS Protcol - Yes instructions given PRE-SURGERY water given  COVID TEST- 09/24/20  Anesthesia review: yes abd EKG - PAC's  Patient denies shortness of breath, fever, cough and chest pain at PAT appointment   All instructions explained to the patient, with a verbal understanding of the material. Patient agrees to go over the instructions while at home for a better understanding. Patient also instructed to self quarantine after being tested for COVID-19. The opportunity to ask questions was provided.

## 2020-09-25 ENCOUNTER — Telehealth: Payer: Self-pay | Admitting: *Deleted

## 2020-09-25 NOTE — Progress Notes (Addendum)
Anesthesia Chart Review:  Case: [id number] Date/Time: 09/27/20 0715   Procedure: LEFT TOTAL KNEE ARTHROPLASTY-CEMENTED (Left Knee)   Anesthesia type: Spinal   Pre-op diagnosis: left knee osteoarthritis   Location: [facility] OR ROOM 06 / [facility]   Surgeons: Marybelle Killings, MD      DISCUSSION: Patient is a 69 year old female scheduled for the above procedure.  History includes former smoker, DM2, HTN, migraines.   Patient had surgical clearance from her PCP Neale Burly, MD. Dr. Lorin Mercy also referred her for cardiology preoperative evaluation, and appointment is scheduled for 09/26/20 with Eleonore Chiquito, MD. HR was 117 on arrival to PAT, but 96 bpm on EKG. She denied shortness of breath and chest pain at PAT RN visit.  Last aspirin 09/23/2020.  09/24/2020 presurgical COVID-19 test negative. Will leave chart for follow-up cardiology input.  ADDENDUM 09/26/20 1:33 PM: Patient was seen by Dr. Audie Box today for preoperative evaluation. He wrote: "Preoperative cardiovascular examination -Preoperative Risk Assessment - The Revised Cardiac Risk Index = 1 (diabetes on insulin), which equates to 0.9% (low risk) estimated risk of perioperative myocardial infarction, pulmonary edema, ventricular fibrillation, cardiac arrest, or complete heart block.  -She can complete greater than 4 METS without any limitations.  Her EKG is normal.  Her cardiovascular examination is normal.  No further cardiac testing is recommended prior to surgery.  -Our service is available as needed in the peri-operative period."     VS: BP 119/72   Pulse (!) 117   Temp 37 C (Oral)   Resp 18   Ht 5\' 4"  (1.626 m)   Wt 80.4 kg   SpO2 100%   BMI 30.42 kg/m  HR 96 bpm on EKG  PROVIDERS: Hasanaj, Samul Dada, MD is PCP    LABS: Labs reviewed: Acceptable for surgery. A1c 6.1% on 09/04/20 at [facility]. (all labs ordered are listed, but only abnormal results are displayed)  Labs Reviewed  SURGICAL PCR SCREEN - Abnormal;  Notable for the following components:      Result Value   Staphylococcus aureus POSITIVE (*)    All other components within normal limits  GLUCOSE, CAPILLARY - Abnormal; Notable for the following components:   Glucose-Capillary 127 (*)    All other components within normal limits  COMPREHENSIVE METABOLIC PANEL - Abnormal; Notable for the following components:   Creatinine, Ser 1.15 (*)    GFR, Estimated 52 (*)    All other components within normal limits  CBC  URINALYSIS, ROUTINE W REFLEX MICROSCOPIC    EKG: 09/24/20: Sinus rhythm with Premature atrial complexes Left axis deviation Minimal voltage criteria for LVH, may be normal variant ( R in aVL ) Cannot rule out Anterior infarct , age undetermined Abnormal ECG   CV: She reported a remote history of a stress test.   Past Medical History:  Diagnosis Date  . Arthritis   . Diabetes mellitus without complication ([place])   . Hypertension   . Migraines   . Osteoporosis     Past Surgical History:  Procedure Laterality Date  . ABDOMINAL HYSTERECTOMY    . KNEE ARTHROSCOPY Right   . TUBAL LIGATION      MEDICATIONS: . alendronate (FOSAMAX) 70 MG tablet  . amLODipine (NORVASC) 10 MG tablet  . aspirin 81 MG EC tablet  . calcium-vitamin D (OSCAL WITH D) 500-200 MG-UNIT tablet  . gabapentin (NEURONTIN) 100 MG capsule  . glipiZIDE (GLUCOTROL) 5 MG tablet  . insulin NPH Human (NOVOLIN N) 100 UNIT/ML injection  .  insulin regular (NOVOLIN R) 100 units/mL injection  . lisinopril-hydrochlorothiazide (ZESTORETIC) 20-25 MG tablet  . Magnesium 250 MG TABS  . metFORMIN (GLUCOPHAGE) 1000 MG tablet  . pravastatin (PRAVACHOL) 80 MG tablet  . topiramate (TOPAMAX) 25 MG tablet  . traZODone (DESYREL) 50 MG tablet   No current facility-administered medications for this encounter.    Myra Gianotti, PA-C Surgical Short Stay/Anesthesiology [facility] Phone [phone] [facility] Phone [phone] 09/25/2020 1:01 PM

## 2020-09-25 NOTE — Progress Notes (Signed)
Cardiology Office Note:   Date:  09/26/2020  NAME:  Vanessa Pierce    MRN: [id number] DOB:  August 06, 1951   PCP:  Neale Burly, MD  Cardiologist:  No primary care provider on file.   Referring MD: Marybelle Killings, MD   Chief Complaint  Patient presents with  . Pre-op Exam   History of Present Illness:   Vanessa Pierce is a 69 y.o. female with a hx of DM, HTN, arthritis who is being seen today for the evaluation of preoperative assessment at the request of Marybelle Killings, MD.  Her CVD risk factors include diabetes on insulin.  She is never had a heart attack or stroke.  She has high blood pressure that is well controlled.  Her most recent lipid profile shows a total cholesterol 200, HDL 74, LDL 81, triglycerides 95.  Her most recent A1c was 6.2.  She reports she is exercise but has no limitations such as chest pain or shortness of breath.  She can climb a flight of stairs without any major limitations.  Her EKG today demonstrates normal sinus rhythm with no acute ischemic changes or evidence of prior infarction.  She has no significant CKD.  No heart failure symptoms or diagnosis of this.  She is never had a stroke.  No prior history of heart attack.  She overall is quite healthy and without any major limitations.  She used to smoke in her younger years but did so for 1 or 2 years.  No alcohol use or drug use reported.  She is retired.   Past Medical History: Past Medical History:  Diagnosis Date  . Arthritis   . Diabetes mellitus without complication ([place])   . Hyperlipidemia   . Hypertension   . Migraines   . Osteoporosis     Past Surgical History: Past Surgical History:  Procedure Laterality Date  . ABDOMINAL HYSTERECTOMY    . KNEE ARTHROSCOPY Right   . TUBAL LIGATION      Current Medications: Current Meds  Medication Sig  . alendronate (FOSAMAX) 70 MG tablet Take 70 mg by mouth once a week.  Marland Kitchen amLODipine (NORVASC) 10 MG tablet Take 10 mg by mouth daily.   .  calcium-vitamin D (OSCAL WITH D) 500-200 MG-UNIT tablet Take 1 tablet by mouth daily with breakfast.  . gabapentin (NEURONTIN) 100 MG capsule Take 100 mg by mouth 3 (three) times daily.  Marland Kitchen glipiZIDE (GLUCOTROL) 5 MG tablet Take 5 mg by mouth 2 (two) times daily.  . insulin NPH Human (NOVOLIN N) 100 UNIT/ML injection Inject 10 Units into the skin 2 (two) times daily with breakfast and lunch.  . insulin regular (NOVOLIN R) 100 units/mL injection Inject 4-8 Units into the skin in the morning and at bedtime.  Marland Kitchen lisinopril-hydrochlorothiazide (ZESTORETIC) 20-25 MG tablet Take 1 tablet by mouth daily.  . Magnesium 250 MG TABS Take 250 mg by mouth daily.  . metFORMIN (GLUCOPHAGE) 1000 MG tablet Take 1,000 mg by mouth 2 (two) times daily.  Marland Kitchen topiramate (TOPAMAX) 25 MG tablet Take 25 mg by mouth 2 (two) times daily.  . traZODone (DESYREL) 50 MG tablet Take 50 mg by mouth at bedtime as needed for sleep.   . [DISCONTINUED] pravastatin (PRAVACHOL) 80 MG tablet Take 80 mg by mouth daily.     Allergies:    Patient has no known allergies.   Social History: Social History   Socioeconomic History  . Marital status: Single    Spouse name: Not  on file  . Number of children: Not on file  . Years of education: Not on file  . Highest education level: Not on file  Occupational History  . Occupation: retired   Tobacco Use  . Smoking status: Never Smoker  . Smokeless tobacco: Never Used  Vaping Use  . Vaping Use: Never used  Substance and Sexual Activity  . Alcohol use: Yes    Comment: occasionally  . Drug use: Never  . Sexual activity: Not Currently  Other Topics Concern  . Not on file  Social History Narrative  . Not on file   Social Determinants of Health   Financial Resource Strain:   . Difficulty of Paying Living Expenses: Not on file  Food Insecurity:   . Worried About Charity fundraiser in the Last Year: Not on file  . Ran Out of Food in the Last Year: Not on file  Transportation  Needs:   . Lack of Transportation (Medical): Not on file  . Lack of Transportation (Non-Medical): Not on file  Physical Activity:   . Days of Exercise per Week: Not on file  . Minutes of Exercise per Session: Not on file  Stress:   . Feeling of Stress : Not on file  Social Connections:   . Frequency of Communication with Friends and Family: Not on file  . Frequency of Social Gatherings with Friends and Family: Not on file  . Attends Religious Services: Not on file  . Active Member of Clubs or Organizations: Not on file  . Attends Archivist Meetings: Not on file  . Marital Status: Not on file     Family History: The patient's family history includes Heart attack in her brother; Pancreatic cancer in her mother.  ROS:   All other ROS reviewed and negative. Pertinent positives noted in the HPI.     EKGs/Labs/Other Studies Reviewed:   The following studies were personally reviewed by me today:  EKG:  EKG is ordered today.  The ekg ordered today demonstrates normal sinus rhythm, rate 94, LVH by voltage, no acute ischemic changes or evidence of prior infarction, and was personally reviewed by me.   Recent Labs: 09/24/2020: ALT 19; BUN 10; Creatinine, Ser 1.15; Hemoglobin 14.4; Platelets 368; Potassium 3.7; Sodium 145   Recent Lipid Panel No results found for: CHOL, TRIG, HDL, CHOLHDL, VLDL, LDLCALC, LDLDIRECT  Physical Exam:   VS:  BP 112/64 (BP Location: Left Arm, Patient Position: Sitting)   Pulse (!) 105   Ht 5\' 4"  (1.626 m)   Wt 177 lb 12.8 oz (80.6 kg)   SpO2 97%   BMI 30.52 kg/m    Wt Readings from Last 3 Encounters:  09/26/20 177 lb 12.8 oz (80.6 kg)  09/24/20 177 lb 3.2 oz (80.4 kg)  09/18/20 178 lb 3.2 oz (80.8 kg)    General: Well nourished, well developed, in no acute distress Heart: Atraumatic, normal size  Eyes: PEERLA, EOMI  Neck: Supple, no JVD Endocrine: No thryomegaly Cardiac: Normal S1, S2; RRR; no murmurs, rubs, or gallops Lungs: Clear to  auscultation bilaterally, no wheezing, rhonchi or rales  Abd: Soft, nontender, no hepatomegaly  Ext: No edema, pulses 2+ Musculoskeletal: No deformities, BUE and BLE strength normal and equal Skin: Warm and dry, no rashes   Neuro: Alert and oriented to person, place, time, and situation, CNII-XII grossly intact, no focal deficits  Psych: Normal mood and affect   ASSESSMENT:   Vanessa Pierce is a 69 y.o. female  who presents for the following: 1. Preoperative cardiovascular examination   2. Mixed hyperlipidemia     PLAN:   1. Preoperative cardiovascular examination -Preoperative Risk Assessment - The Revised Cardiac Risk Index = 1 (diabetes on insulin), which equates to 0.9% (low risk) estimated risk of perioperative myocardial infarction, pulmonary edema, ventricular fibrillation, cardiac arrest, or complete heart block.  -She can complete greater than 4 METS without any limitations.  Her EKG is normal.  Her cardiovascular examination is normal.  No further cardiac testing is recommended prior to surgery.  -Our service is available as needed in the peri-operative period.    2. Mixed hyperlipidemia -Most recent LDL cholesterol 80.  Due to her diabetes I would recommend she get this less than 70.  I recommended to switch to Crestor 20 mg a day.  She will see [place] yearly.   Disposition: Return in about 1 year (around 09/26/2021).  Medication Adjustments/Labs and Tests Ordered: Current medicines are reviewed at length with the patient today.  Concerns regarding medicines are outlined above.  Orders Placed This Encounter  Procedures  . EKG 12-Lead   Meds ordered this encounter  Medications  . rosuvastatin (CRESTOR) 20 MG tablet    Sig: Take 1 tablet (20 mg total) by mouth daily.    Dispense:  90 tablet    Refill:  3    Patient Instructions  Medication Instructions:  Stop Pravastatin 80 mg daily Start Rosuvastatin (Crestor) 20 mg daily  *If you need a refill on your cardiac  medications before your next appointment, please call your pharmacy*   Lab Work: None  If you have labs (blood work) drawn today and your tests are completely normal, you will receive your results only by: Marland Kitchen MyChart Message (if you have MyChart) OR . A paper copy in the mail If you have any lab test that is abnormal or we need to change your treatment, we will call you to review the results.   Testing/Procedures: None   Follow-Up: At [facility], you and your health needs are our priority.  As part of our continuing mission to provide you with exceptional heart care, we have created designated Provider Care Teams.  These Care Teams include your primary Cardiologist (physician) and Advanced Practice Providers (APPs -  Physician Assistants and Nurse Practitioners) who all work together to provide you with the care you need, when you need it.  We recommend signing up for the patient portal called "MyChart".  Sign up information is provided on this After Visit Summary.  MyChart is used to connect with patients for Virtual Visits (Telemedicine).  Patients are able to view lab/test results, encounter notes, upcoming appointments, etc.  Non-urgent messages can be sent to your provider as well.   To learn more about what you can do with MyChart, go to [email or web address].    Your next appointment:   1 year(s)  The format for your next appointment:   In Person  Provider:   Eleonore Chiquito, MD      Signed, Addison Naegeli. Audie Box, [place]  [address], [place], [place] [address] [phone]  09/26/2020 10:12 AM

## 2020-09-25 NOTE — Telephone Encounter (Signed)
Ortho bundle Pre-op call completed. 

## 2020-09-25 NOTE — Care Plan (Signed)
RNCM call to patient to discuss her upcoming Left total knee replacement with Dr. Lorin Mercy. She is an Ortho bundle patient through [facility] and is agreeable to case management. Reviewed all post-op care instructions. Patient lives with her brother and has a friend and several family members that will be assisting after surgery. She has all DME needed (FWW, BSC/3in1, shower chair, cane). Choice provided and referral made to [facility] for [place] after discharge. Anticipate her to begin OPPT at approximately 2 weeks post-op. She would like somewhere near her home. RNCM will schedule this initial visit for her. Will continue to follow for needs.

## 2020-09-26 ENCOUNTER — Other Ambulatory Visit: Payer: Self-pay

## 2020-09-26 ENCOUNTER — Encounter: Payer: Self-pay | Admitting: Cardiovascular Disease

## 2020-09-26 ENCOUNTER — Ambulatory Visit (INDEPENDENT_AMBULATORY_CARE_PROVIDER_SITE_OTHER): Payer: Medicare Other | Admitting: Cardiovascular Disease

## 2020-09-26 VITALS — BP 112/64 | HR 105 | Ht 64.0 in | Wt 177.8 lb

## 2020-09-26 DIAGNOSIS — Z01810 Encounter for preprocedural cardiovascular examination: Secondary | ICD-10-CM

## 2020-09-26 DIAGNOSIS — E782 Mixed hyperlipidemia: Secondary | ICD-10-CM | POA: Diagnosis not present

## 2020-09-26 MED ORDER — ROSUVASTATIN CALCIUM 20 MG PO TABS: 20.0000 mg | ORAL_TABLET | Freq: Every day | ORAL | 3 refills | Status: AC

## 2020-09-26 NOTE — H&P (Signed)
TOTAL KNEE ADMISSION H&P  Patient is being admitted for left total knee arthroplasty.  Subjective:  Chief Complaint:left knee pain.  HPI: Vanessa Pierce, 69 y.o. female, has a history of pain and functional disability in the left knee due to arthritis and has failed non-surgical conservative treatments for greater than 12 weeks to includeNSAID's and/or analgesics.  Onset of symptoms was gradual, starting 10 years ago with gradually worsening course since that time. The patient noted no past surgery on the left knee(s).  Patient currently rates pain in the left knee(s) at 10 out of 10 with activity. Patient has night pain, worsening of pain with activity and weight bearing, pain that interferes with activities of daily living and joint swelling.  Patient has evidence of subchondral cysts, subchondral sclerosis and periarticular osteophytes by imaging studies.  There is no active infection.  Patient Active Problem List   Diagnosis Date Noted  . Bilateral primary osteoarthritis of knee 07/04/2020   Past Medical History:  Diagnosis Date  . Arthritis   . Diabetes mellitus without complication ([place])   . Hyperlipidemia   . Hypertension   . Migraines   . Osteoporosis     Past Surgical History:  Procedure Laterality Date  . ABDOMINAL HYSTERECTOMY    . KNEE ARTHROSCOPY Right   . TUBAL LIGATION      No current facility-administered medications for this encounter.   Current Outpatient Medications  Medication Sig Dispense Refill Last Dose  . alendronate (FOSAMAX) 70 MG tablet Take 70 mg by mouth once a week.     Marland Kitchen amLODipine (NORVASC) 10 MG tablet Take 10 mg by mouth daily.      Marland Kitchen aspirin 81 MG EC tablet Take 81 mg by mouth daily.  (Patient not taking: Reported on 09/26/2020)     . calcium-vitamin D (OSCAL WITH D) 500-200 MG-UNIT tablet Take 1 tablet by mouth daily with breakfast.     . gabapentin (NEURONTIN) 100 MG capsule Take 100 mg by mouth 3 (three) times daily.     Marland Kitchen glipiZIDE  (GLUCOTROL) 5 MG tablet Take 5 mg by mouth 2 (two) times daily.     . insulin NPH Human (NOVOLIN N) 100 UNIT/ML injection Inject 10 Units into the skin 2 (two) times daily with breakfast and lunch.     . insulin regular (NOVOLIN R) 100 units/mL injection Inject 4-8 Units into the skin in the morning and at bedtime.     Marland Kitchen lisinopril-hydrochlorothiazide (ZESTORETIC) 20-25 MG tablet Take 1 tablet by mouth daily.     . Magnesium 250 MG TABS Take 250 mg by mouth daily.     . metFORMIN (GLUCOPHAGE) 1000 MG tablet Take 1,000 mg by mouth 2 (two) times daily.     Marland Kitchen topiramate (TOPAMAX) 25 MG tablet Take 25 mg by mouth 2 (two) times daily.     . traZODone (DESYREL) 50 MG tablet Take 50 mg by mouth at bedtime as needed for sleep.      . rosuvastatin (CRESTOR) 20 MG tablet Take 1 tablet (20 mg total) by mouth daily. 90 tablet 3    No Known Allergies  Social History   Tobacco Use  . Smoking status: Never Smoker  . Smokeless tobacco: Never Used  Substance Use Topics  . Alcohol use: Yes    Comment: occasionally    Family History  Problem Relation Age of Onset  . Pancreatic cancer Mother   . Heart attack Brother      Review of Systems  Constitutional:  Positive for activity change.  HENT: Negative.   Respiratory: Negative.   Cardiovascular: Negative.   Gastrointestinal: Negative.   Genitourinary: Negative.   Musculoskeletal: Positive for gait problem and joint swelling.  Psychiatric/Behavioral: Negative.     Objective:  Physical Exam HENT:     Head: Normocephalic and atraumatic.     Nose: Nose normal.     Mouth/Throat:     Mouth: Mucous membranes are dry.  Cardiovascular:     Rate and Rhythm: Regular rhythm.  Pulmonary:     Effort: Pulmonary effort is normal. No respiratory distress.     Breath sounds: Normal breath sounds.  Musculoskeletal:        General: Tenderness present.  Neurological:     General: No focal deficit present.     Mental Status: She is alert and oriented to  person, place, and time.     Vital signs in last 24 hours: Pulse Rate:  [105] 105 (11/18 0919) BP: (112)/(64) 112/64 (11/18 0919) SpO2:  [97 %] 97 % (11/18 0919) Weight:  [80.6 kg] 80.6 kg (11/18 0919)  Labs:   Estimated body mass index is 30.52 kg/m as calculated from the following:   Height as of 09/26/20: 5\' 4"  (1.626 m).   Weight as of 09/26/20: 80.6 kg.   Imaging Review Plain radiographs demonstrate moderate degenerative joint disease of the left knee(s). The overall alignment ismild varus. The bone quality appears to be good for age and reported activity level.      Assessment/Plan:  End stage arthritis, left knee   The patient history, physical examination, clinical judgment of the provider and imaging studies are consistent with end stage degenerative joint disease of the left knee(s) and total knee arthroplasty is deemed medically necessary. The treatment options including medical management, injection therapy arthroscopy and arthroplasty were discussed at length. The risks and benefits of total knee arthroplasty were presented and reviewed. The risks due to aseptic loosening, infection, stiffness, patella tracking problems, thromboembolic complications and other imponderables were discussed. The patient acknowledged the explanation, agreed to proceed with the plan and consent was signed. Patient is being admitted for inpatient treatment for surgery, pain control, PT, OT, prophylactic antibiotics, VTE prophylaxis, progressive ambulation and ADL's and discharge planning. The patient is planning to be discharged home with home health services     Patient's anticipated LOS is less than 2 midnights, meeting these requirements: - Younger than 65 - Lives within 1 hour of care - Has a competent adult at home to recover with post-op recover - NO history of  - Chronic pain requiring opiods  - Diabetes  - Coronary Artery Disease  - Heart failure  - Heart attack  -  Stroke  - DVT/VTE  - Cardiac arrhythmia  - Respiratory Failure/COPD  - Renal failure  - Anemia  - Advanced Liver disease

## 2020-09-26 NOTE — Patient Instructions (Signed)
Medication Instructions:  Stop Pravastatin 80 mg daily Start Rosuvastatin (Crestor) 20 mg daily  *If you need a refill on your cardiac medications before your next appointment, please call your pharmacy*   Lab Work: None  If you have labs (blood work) drawn today and your tests are completely normal, you will receive your results only by: Marland Kitchen MyChart Message (if you have MyChart) OR . A paper copy in the mail If you have any lab test that is abnormal or we need to change your treatment, we will call you to review the results.   Testing/Procedures: None   Follow-Up: At [facility], you and your health needs are our priority.  As part of our continuing mission to provide you with exceptional heart care, we have created designated Provider Care Teams.  These Care Teams include your primary Cardiologist (physician) and Advanced Practice Providers (APPs -  Physician Assistants and Nurse Practitioners) who all work together to provide you with the care you need, when you need it.  We recommend signing up for the patient portal called "MyChart".  Sign up information is provided on this After Visit Summary.  MyChart is used to connect with patients for Virtual Visits (Telemedicine).  Patients are able to view lab/test results, encounter notes, upcoming appointments, etc.  Non-urgent messages can be sent to your provider as well.   To learn more about what you can do with MyChart, go to [email or web address].    Your next appointment:   1 year(s)  The format for your next appointment:   In Person  Provider:   Eleonore Chiquito, MD

## 2020-09-26 NOTE — Anesthesia Preprocedure Evaluation (Addendum)
Anesthesia Evaluation  Patient identified by MRN, date of birth, ID band Patient awake    Reviewed: Allergy & Precautions, NPO status , Patient's Chart, lab work & pertinent test results  Airway Mallampati: III  TM Distance: >3 FB Neck ROM: Full  Mouth opening: Limited Mouth Opening  Dental  (+) Missing, Dental Advisory Given,    Pulmonary neg pulmonary ROS,    Pulmonary exam normal breath sounds clear to auscultation       Cardiovascular hypertension, Pt. on medications Normal cardiovascular exam Rhythm:Regular Rate:Normal     Neuro/Psych  Headaches, negative psych ROS   GI/Hepatic negative GI ROS, Neg liver ROS,   Endo/Other  diabetes, Type 2, Oral Hypoglycemic Agents, Insulin Dependent  Renal/GU negative Renal ROS  negative genitourinary   Musculoskeletal  (+) Arthritis ,   Abdominal   Peds  Hematology negative hematology ROS (+)   Anesthesia Other Findings   Reproductive/Obstetrics                           Anesthesia Physical Anesthesia Plan  ASA: II  Anesthesia Plan: Spinal and Regional   Post-op Pain Management:  Regional for Post-op pain   Induction:   PONV Risk Score and Plan: 2 and Treatment may vary due to age or medical condition, Propofol infusion and Midazolam  Airway Management Planned: Natural Airway  Additional Equipment:   Intra-op Plan:   Post-operative Plan:   Informed Consent: I have reviewed the patients History and Physical, chart, labs and discussed the procedure including the risks, benefits and alternatives for the proposed anesthesia with the patient or authorized representative who has indicated his/her understanding and acceptance.     Dental advisory given  Plan Discussed with: CRNA  Anesthesia Plan Comments: (PAT note written by Myra Gianotti, PA-C. )       Anesthesia Quick Evaluation

## 2020-09-27 ENCOUNTER — Ambulatory Visit (HOSPITAL_COMMUNITY): Payer: Medicare Other | Admitting: Vascular Surgery

## 2020-09-27 ENCOUNTER — Observation Stay (HOSPITAL_COMMUNITY)
Admission: RE | Admit: 2020-09-27 | Discharge: 2020-09-28 | Disposition: A | Discharge status: Home / Self Care | Payer: Medicare Other | Attending: Orthopaedic Surgery | Admitting: Orthopaedic Surgery

## 2020-09-27 ENCOUNTER — Observation Stay (HOSPITAL_COMMUNITY): Payer: Medicare Other

## 2020-09-27 ENCOUNTER — Ambulatory Visit (HOSPITAL_COMMUNITY): Payer: Medicare Other | Admitting: Anesthesiology

## 2020-09-27 ENCOUNTER — Encounter (HOSPITAL_COMMUNITY): Payer: Self-pay | Admitting: Orthopaedic Surgery

## 2020-09-27 ENCOUNTER — Encounter (HOSPITAL_COMMUNITY)
Admission: RE | Disposition: A | Discharge status: Home / Self Care | Payer: Self-pay | Source: Home / Self Care | Attending: Orthopaedic Surgery

## 2020-09-27 ENCOUNTER — Other Ambulatory Visit: Payer: Self-pay

## 2020-09-27 DIAGNOSIS — Z7982 Long term (current) use of aspirin: Secondary | ICD-10-CM | POA: Insufficient documentation

## 2020-09-27 DIAGNOSIS — Z794 Long term (current) use of insulin: Secondary | ICD-10-CM | POA: Diagnosis not present

## 2020-09-27 DIAGNOSIS — Z471 Aftercare following joint replacement surgery: Secondary | ICD-10-CM | POA: Diagnosis not present

## 2020-09-27 DIAGNOSIS — M1712 Unilateral primary osteoarthritis, left knee: Principal | ICD-10-CM

## 2020-09-27 DIAGNOSIS — G8918 Other acute postprocedural pain: Secondary | ICD-10-CM | POA: Diagnosis not present

## 2020-09-27 DIAGNOSIS — Z7984 Long term (current) use of oral hypoglycemic drugs: Secondary | ICD-10-CM | POA: Insufficient documentation

## 2020-09-27 DIAGNOSIS — Z79899 Other long term (current) drug therapy: Secondary | ICD-10-CM | POA: Diagnosis not present

## 2020-09-27 DIAGNOSIS — I10 Essential (primary) hypertension: Secondary | ICD-10-CM | POA: Insufficient documentation

## 2020-09-27 DIAGNOSIS — Z09 Encounter for follow-up examination after completed treatment for conditions other than malignant neoplasm: Secondary | ICD-10-CM

## 2020-09-27 DIAGNOSIS — Z96652 Presence of left artificial knee joint: Secondary | ICD-10-CM | POA: Diagnosis not present

## 2020-09-27 DIAGNOSIS — E119 Type 2 diabetes mellitus without complications: Secondary | ICD-10-CM | POA: Insufficient documentation

## 2020-09-27 DIAGNOSIS — M1711 Unilateral primary osteoarthritis, right knee: Secondary | ICD-10-CM | POA: Diagnosis present

## 2020-09-27 DIAGNOSIS — M25562 Pain in left knee: Secondary | ICD-10-CM | POA: Diagnosis present

## 2020-09-27 HISTORY — PX: TOTAL KNEE ARTHROPLASTY: SHX125

## 2020-09-27 LAB — GLUCOSE, CAPILLARY
Glucose-Capillary: 119 mg/dL — ABNORMAL HIGH (ref 70–99)
Glucose-Capillary: 137 mg/dL — ABNORMAL HIGH (ref 70–99)
Glucose-Capillary: 154 mg/dL — ABNORMAL HIGH (ref 70–99)
Glucose-Capillary: 234 mg/dL — ABNORMAL HIGH (ref 70–99)
Glucose-Capillary: 246 mg/dL — ABNORMAL HIGH (ref 70–99)

## 2020-09-27 LAB — HEMOGLOBIN A1C
Hgb A1c MFr Bld: 6.2 % — ABNORMAL HIGH (ref 4.8–5.6)
Mean Plasma Glucose: 131.24 mg/dL

## 2020-09-27 SURGERY — ARTHROPLASTY, KNEE, TOTAL
Anesthesia: Regional | Site: Knee | Laterality: Left

## 2020-09-27 MED ORDER — LACTATED RINGERS IV SOLN: INTRAVENOUS | Status: DC | PRN

## 2020-09-27 MED ORDER — TRANEXAMIC ACID-NACL 1000-0.7 MG/100ML-% IV SOLN
INTRAVENOUS | Status: AC
  Filled 2020-09-27: qty 100

## 2020-09-27 MED ORDER — HYDROMORPHONE HCL 1 MG/ML IJ SOLN
0.5000 mg | INTRAMUSCULAR | Status: DC | PRN
  Administered 2020-09-27 – 2020-09-28 (×3): 0.5 mg via INTRAVENOUS
  Filled 2020-09-27 (×3): qty 0.5

## 2020-09-27 MED ORDER — LISINOPRIL-HYDROCHLOROTHIAZIDE 20-25 MG PO TABS: 1.0000 | ORAL_TABLET | Freq: Every day | ORAL | Status: DC

## 2020-09-27 MED ORDER — BUPIVACAINE LIPOSOME 1.3 % IJ SUSP
INTRAMUSCULAR | Status: DC | PRN
  Administered 2020-09-27: 20 mL

## 2020-09-27 MED ORDER — OXYCODONE-ACETAMINOPHEN 5-325 MG PO TABS
1.0000 | ORAL_TABLET | ORAL | 0 refills | Status: DC | PRN
Start: 2020-09-27 — End: 2020-09-27

## 2020-09-27 MED ORDER — BUPIVACAINE HCL (PF) 0.25 % IJ SOLN
INTRAMUSCULAR | Status: AC
  Filled 2020-09-27: qty 30

## 2020-09-27 MED ORDER — BUPIVACAINE IN DEXTROSE 0.75-8.25 % IT SOLN
INTRATHECAL | Status: DC | PRN
  Administered 2020-09-27: 1.6 mL via INTRATHECAL

## 2020-09-27 MED ORDER — PHENYLEPHRINE HCL-NACL 10-0.9 MG/250ML-% IV SOLN
INTRAVENOUS | Status: DC | PRN
  Administered 2020-09-27: 25 ug/min via INTRAVENOUS

## 2020-09-27 MED ORDER — LISINOPRIL 20 MG PO TABS
20.0000 mg | ORAL_TABLET | Freq: Every day | ORAL | Status: DC
  Administered 2020-09-28: 20 mg via ORAL
  Filled 2020-09-27: qty 1

## 2020-09-27 MED ORDER — OXYCODONE HCL 5 MG PO TABS
5.0000 mg | ORAL_TABLET | ORAL | Status: DC | PRN
  Administered 2020-09-27 – 2020-09-28 (×6): 5 mg via ORAL
  Filled 2020-09-27 (×6): qty 1

## 2020-09-27 MED ORDER — MENTHOL 3 MG MT LOZG: 1.0000 | LOZENGE | OROMUCOSAL | Status: DC | PRN

## 2020-09-27 MED ORDER — TRAZODONE HCL 50 MG PO TABS: 50.0000 mg | ORAL_TABLET | Freq: Every evening | ORAL | Status: DC | PRN

## 2020-09-27 MED ORDER — ROPIVACAINE HCL 5 MG/ML IJ SOLN
INTRAMUSCULAR | Status: DC | PRN
  Administered 2020-09-27: 20 mL via PERINEURAL

## 2020-09-27 MED ORDER — INSULIN NPH (HUMAN) (ISOPHANE) 100 UNIT/ML ~~LOC~~ SUSP
10.0000 [IU] | Freq: Two times a day (BID) | SUBCUTANEOUS | Status: DC
  Filled 2020-09-27: qty 10

## 2020-09-27 MED ORDER — OXYCODONE-ACETAMINOPHEN 5-325 MG PO TABS
1.0000 | ORAL_TABLET | ORAL | 0 refills | Status: DC | PRN
Start: 2020-09-27 — End: 2020-10-07

## 2020-09-27 MED ORDER — ASPIRIN EC 325 MG PO TBEC: 325.0000 mg | DELAYED_RELEASE_TABLET | Freq: Every day | ORAL | 0 refills | Status: DC

## 2020-09-27 MED ORDER — ASPIRIN EC 325 MG PO TBEC
325.0000 mg | DELAYED_RELEASE_TABLET | Freq: Every day | ORAL | Status: DC
  Administered 2020-09-28: 325 mg via ORAL
  Filled 2020-09-27: qty 1

## 2020-09-27 MED ORDER — HYDROCHLOROTHIAZIDE 25 MG PO TABS
25.0000 mg | ORAL_TABLET | Freq: Every day | ORAL | Status: DC
  Administered 2020-09-28: 25 mg via ORAL
  Filled 2020-09-27: qty 1

## 2020-09-27 MED ORDER — FENTANYL CITRATE (PF) 100 MCG/2ML IJ SOLN
25.0000 ug | INTRAMUSCULAR | Status: DC | PRN
  Administered 2020-09-27: 25 ug via INTRAVENOUS

## 2020-09-27 MED ORDER — METHOCARBAMOL 500 MG PO TABS: 500.0000 mg | ORAL_TABLET | Freq: Four times a day (QID) | ORAL | 0 refills | Status: DC | PRN

## 2020-09-27 MED ORDER — ONDANSETRON HCL 4 MG PO TABS: 4.0000 mg | ORAL_TABLET | Freq: Four times a day (QID) | ORAL | Status: DC | PRN

## 2020-09-27 MED ORDER — BUPIVACAINE HCL (PF) 0.25 % IJ SOLN
INTRAMUSCULAR | Status: DC | PRN
  Administered 2020-09-27: 30 mL

## 2020-09-27 MED ORDER — LACTATED RINGERS IV SOLN: INTRAVENOUS | Status: DC

## 2020-09-27 MED ORDER — METOCLOPRAMIDE HCL 5 MG PO TABS: 5.0000 mg | ORAL_TABLET | Freq: Three times a day (TID) | ORAL | Status: DC | PRN

## 2020-09-27 MED ORDER — MIDAZOLAM HCL 2 MG/2ML IJ SOLN
INTRAMUSCULAR | Status: AC
  Filled 2020-09-27: qty 2

## 2020-09-27 MED ORDER — METHOCARBAMOL 500 MG PO TABS
500.0000 mg | ORAL_TABLET | Freq: Four times a day (QID) | ORAL | 0 refills | Status: DC | PRN
Start: 2020-09-27 — End: 2022-01-08

## 2020-09-27 MED ORDER — OXYCODONE HCL 5 MG PO TABS
ORAL_TABLET | ORAL | Status: AC
  Administered 2020-09-27: 5 mg via ORAL
  Filled 2020-09-27: qty 1

## 2020-09-27 MED ORDER — FENTANYL CITRATE (PF) 250 MCG/5ML IJ SOLN
INTRAMUSCULAR | Status: DC | PRN
  Administered 2020-09-27: 25 ug via INTRAVENOUS
  Administered 2020-09-27: 50 ug via INTRAVENOUS
  Administered 2020-09-27: 25 ug via INTRAVENOUS

## 2020-09-27 MED ORDER — ACETAMINOPHEN 325 MG PO TABS: 325.0000 mg | ORAL_TABLET | Freq: Four times a day (QID) | ORAL | Status: DC | PRN

## 2020-09-27 MED ORDER — METHOCARBAMOL 500 MG PO TABS
ORAL_TABLET | ORAL | Status: AC
  Administered 2020-09-27: 500 mg via ORAL
  Filled 2020-09-27: qty 1

## 2020-09-27 MED ORDER — PROPOFOL 10 MG/ML IV BOLUS
INTRAVENOUS | Status: DC | PRN
  Administered 2020-09-27 (×5): 20 mg via INTRAVENOUS

## 2020-09-27 MED ORDER — ONDANSETRON HCL 4 MG/2ML IJ SOLN
4.0000 mg | Freq: Four times a day (QID) | INTRAMUSCULAR | Status: DC | PRN
  Administered 2020-09-28: 4 mg via INTRAVENOUS
  Filled 2020-09-27: qty 2

## 2020-09-27 MED ORDER — 0.9 % SODIUM CHLORIDE (POUR BTL) OPTIME
TOPICAL | Status: DC | PRN
  Administered 2020-09-27: 1000 mL

## 2020-09-27 MED ORDER — DEXAMETHASONE SODIUM PHOSPHATE 10 MG/ML IJ SOLN
INTRAMUSCULAR | Status: DC | PRN
  Administered 2020-09-27: 5 mg

## 2020-09-27 MED ORDER — CALCIUM CARBONATE-VITAMIN D 500-200 MG-UNIT PO TABS
1.0000 | ORAL_TABLET | Freq: Every day | ORAL | Status: DC
  Administered 2020-09-28: 09:00:00 1 via ORAL
  Filled 2020-09-27: qty 1

## 2020-09-27 MED ORDER — METFORMIN HCL 500 MG PO TABS
1000.0000 mg | ORAL_TABLET | Freq: Two times a day (BID) | ORAL | Status: DC
  Administered 2020-09-27 – 2020-09-28 (×2): 1000 mg via ORAL
  Filled 2020-09-27 (×2): qty 2

## 2020-09-27 MED ORDER — ACETAMINOPHEN 500 MG PO TABS
1000.0000 mg | ORAL_TABLET | Freq: Once | ORAL | Status: AC
  Administered 2020-09-27: 1000 mg via ORAL
  Filled 2020-09-27: qty 2

## 2020-09-27 MED ORDER — PROPOFOL 10 MG/ML IV BOLUS
INTRAVENOUS | Status: AC
  Filled 2020-09-27: qty 20

## 2020-09-27 MED ORDER — PHENOL 1.4 % MT LIQD: 1.0000 | OROMUCOSAL | Status: DC | PRN

## 2020-09-27 MED ORDER — METOCLOPRAMIDE HCL 5 MG/ML IJ SOLN: 5.0000 mg | Freq: Three times a day (TID) | INTRAMUSCULAR | Status: DC | PRN

## 2020-09-27 MED ORDER — GLIPIZIDE 5 MG PO TABS
5.0000 mg | ORAL_TABLET | Freq: Two times a day (BID) | ORAL | Status: DC
  Administered 2020-09-27 – 2020-09-28 (×2): 5 mg via ORAL
  Filled 2020-09-27 (×2): qty 1

## 2020-09-27 MED ORDER — MAGNESIUM 250 MG PO TABS: 250.0000 mg | ORAL_TABLET | Freq: Every day | ORAL | Status: DC

## 2020-09-27 MED ORDER — TRANEXAMIC ACID-NACL 1000-0.7 MG/100ML-% IV SOLN
INTRAVENOUS | Status: DC | PRN
  Administered 2020-09-27: 1000 mg via INTRAVENOUS

## 2020-09-27 MED ORDER — AMLODIPINE BESYLATE 10 MG PO TABS
10.0000 mg | ORAL_TABLET | Freq: Every day | ORAL | Status: DC
  Administered 2020-09-28: 10 mg via ORAL
  Filled 2020-09-27: qty 1

## 2020-09-27 MED ORDER — METHOCARBAMOL 1000 MG/10ML IJ SOLN
500.0000 mg | Freq: Four times a day (QID) | INTRAVENOUS | Status: DC | PRN
  Filled 2020-09-27: qty 5

## 2020-09-27 MED ORDER — CEFAZOLIN SODIUM-DEXTROSE 2-4 GM/100ML-% IV SOLN
2.0000 g | INTRAVENOUS | Status: AC
  Administered 2020-09-27: 2 g via INTRAVENOUS
  Filled 2020-09-27: qty 100

## 2020-09-27 MED ORDER — ROSUVASTATIN CALCIUM 20 MG PO TABS
20.0000 mg | ORAL_TABLET | Freq: Every day | ORAL | Status: DC
  Administered 2020-09-27 – 2020-09-28 (×2): 20 mg via ORAL
  Filled 2020-09-27 (×3): qty 1

## 2020-09-27 MED ORDER — GABAPENTIN 100 MG PO CAPS
100.0000 mg | ORAL_CAPSULE | Freq: Three times a day (TID) | ORAL | Status: DC
  Administered 2020-09-27 – 2020-09-28 (×3): 100 mg via ORAL
  Filled 2020-09-27 (×3): qty 1

## 2020-09-27 MED ORDER — INSULIN ASPART 100 UNIT/ML ~~LOC~~ SOLN: 100.0000 [IU] | Freq: Two times a day (BID) | SUBCUTANEOUS | Status: DC

## 2020-09-27 MED ORDER — SODIUM CHLORIDE 0.9 % IV SOLN: INTRAVENOUS | Status: DC

## 2020-09-27 MED ORDER — INSULIN NPH (HUMAN) (ISOPHANE) 100 UNIT/ML ~~LOC~~ SUSP
10.0000 [IU] | Freq: Two times a day (BID) | SUBCUTANEOUS | Status: DC
  Administered 2020-09-27 – 2020-09-28 (×2): 10 [IU] via SUBCUTANEOUS

## 2020-09-27 MED ORDER — BUPIVACAINE LIPOSOME 1.3 % IJ SUSP
20.0000 mL | Freq: Once | INTRAMUSCULAR | Status: DC
  Filled 2020-09-27: qty 20

## 2020-09-27 MED ORDER — FENTANYL CITRATE (PF) 100 MCG/2ML IJ SOLN
INTRAMUSCULAR | Status: AC
  Filled 2020-09-27: qty 2

## 2020-09-27 MED ORDER — PROPOFOL 500 MG/50ML IV EMUL
INTRAVENOUS | Status: DC | PRN
  Administered 2020-09-27: 50 ug/kg/min via INTRAVENOUS

## 2020-09-27 MED ORDER — ORAL CARE MOUTH RINSE: 15.0000 mL | Freq: Once | OROMUCOSAL | Status: AC

## 2020-09-27 MED ORDER — METHOCARBAMOL 500 MG PO TABS
500.0000 mg | ORAL_TABLET | Freq: Four times a day (QID) | ORAL | Status: DC | PRN
  Administered 2020-09-27 – 2020-09-28 (×2): 500 mg via ORAL
  Filled 2020-09-27 (×2): qty 1

## 2020-09-27 MED ORDER — DOCUSATE SODIUM 100 MG PO CAPS
100.0000 mg | ORAL_CAPSULE | Freq: Two times a day (BID) | ORAL | Status: DC
  Administered 2020-09-27 – 2020-09-28 (×3): 100 mg via ORAL
  Filled 2020-09-27 (×3): qty 1

## 2020-09-27 MED ORDER — MIDAZOLAM HCL 2 MG/2ML IJ SOLN
INTRAMUSCULAR | Status: DC | PRN
  Administered 2020-09-27: 2 mg via INTRAVENOUS

## 2020-09-27 MED ORDER — MAGNESIUM GLUCONATE 500 MG PO TABS
250.0000 mg | ORAL_TABLET | Freq: Every day | ORAL | Status: DC
  Administered 2020-09-27 – 2020-09-28 (×2): 250 mg via ORAL
  Filled 2020-09-27 (×2): qty 1

## 2020-09-27 MED ORDER — ONDANSETRON HCL 4 MG/2ML IJ SOLN
INTRAMUSCULAR | Status: DC | PRN
  Administered 2020-09-27: 4 mg via INTRAVENOUS

## 2020-09-27 MED ORDER — PHENYLEPHRINE 40 MCG/ML (10ML) SYRINGE FOR IV PUSH (FOR BLOOD PRESSURE SUPPORT)
PREFILLED_SYRINGE | INTRAVENOUS | Status: DC | PRN
  Administered 2020-09-27 (×3): 80 ug via INTRAVENOUS

## 2020-09-27 MED ORDER — FENTANYL CITRATE (PF) 250 MCG/5ML IJ SOLN
INTRAMUSCULAR | Status: AC
  Filled 2020-09-27: qty 5

## 2020-09-27 MED ORDER — TOPIRAMATE 25 MG PO TABS
25.0000 mg | ORAL_TABLET | Freq: Two times a day (BID) | ORAL | Status: DC
  Administered 2020-09-27 – 2020-09-28 (×2): 25 mg via ORAL
  Filled 2020-09-27 (×2): qty 1

## 2020-09-27 MED ORDER — SODIUM CHLORIDE 0.9 % IR SOLN
Status: DC | PRN
  Administered 2020-09-27: 3000 mL

## 2020-09-27 MED ORDER — CHLORHEXIDINE GLUCONATE 0.12 % MT SOLN
15.0000 mL | Freq: Once | OROMUCOSAL | Status: AC
  Administered 2020-09-27: 15 mL via OROMUCOSAL
  Filled 2020-09-27: qty 15

## 2020-09-27 MED ORDER — POLYETHYLENE GLYCOL 3350 17 G PO PACK: 17.0000 g | PACK | Freq: Every day | ORAL | Status: DC | PRN

## 2020-09-27 MED ORDER — INSULIN ASPART 100 UNIT/ML ~~LOC~~ SOLN
0.0000 [IU] | Freq: Three times a day (TID) | SUBCUTANEOUS | Status: DC
  Administered 2020-09-27: 5 [IU] via SUBCUTANEOUS
  Administered 2020-09-28 (×2): 3 [IU] via SUBCUTANEOUS

## 2020-09-27 SURGICAL SUPPLY — 69 items
ATTUNE PS FEM LT SZ 4 CEM KNEE (Femur) ×3 IMPLANT
ATTUNE PSRP INSR SZ4 5 KNEE (Insert) ×2 IMPLANT
ATTUNE PSRP INSR SZ4 5MM KNEE (Insert) ×1 IMPLANT
BANDAGE ESMARK 6X9 LF (GAUZE/BANDAGES/DRESSINGS) ×1 IMPLANT
BASEPLATE TIBIAL ROTATING SZ 4 (Knees) ×3 IMPLANT
BENZOIN TINCTURE PRP APPL 2/3 (GAUZE/BANDAGES/DRESSINGS) ×3 IMPLANT
BLADE SAGITTAL 25.0X1.19X90 (BLADE) ×2 IMPLANT
BLADE SAGITTAL 25.0X1.19X90MM (BLADE) ×1
BLADE SAW SGTL 13X75X1.27 (BLADE) ×3 IMPLANT
BNDG ELASTIC 4X5.8 VLCR STR LF (GAUZE/BANDAGES/DRESSINGS) ×3 IMPLANT
BNDG ELASTIC 6X5.8 VLCR STR LF (GAUZE/BANDAGES/DRESSINGS) ×3 IMPLANT
BNDG ESMARK 6X9 LF (GAUZE/BANDAGES/DRESSINGS) ×3
BOWL SMART MIX CTS (DISPOSABLE) ×3 IMPLANT
CEMENT HV SMART SET (Cement) ×6 IMPLANT
COVER SURGICAL LIGHT HANDLE (MISCELLANEOUS) ×3 IMPLANT
COVER WAND RF STERILE (DRAPES) ×3 IMPLANT
CUFF TOURN SGL QUICK 34 (TOURNIQUET CUFF) ×2
CUFF TOURN SGL QUICK 42 (TOURNIQUET CUFF) IMPLANT
CUFF TRNQT CYL 34X4.125X (TOURNIQUET CUFF) ×1 IMPLANT
DRAPE ORTHO SPLIT 77X108 STRL (DRAPES) ×4
DRAPE SURG ORHT 6 SPLT 77X108 (DRAPES) ×2 IMPLANT
DRAPE U-SHAPE 47X51 STRL (DRAPES) ×3 IMPLANT
DRSG PAD ABDOMINAL 8X10 ST (GAUZE/BANDAGES/DRESSINGS) ×3 IMPLANT
DURAPREP 26ML APPLICATOR (WOUND CARE) ×6 IMPLANT
ELECT REM PT RETURN 9FT ADLT (ELECTROSURGICAL) ×3
ELECTRODE REM PT RTRN 9FT ADLT (ELECTROSURGICAL) ×1 IMPLANT
EVACUATOR 1/8 PVC DRAIN (DRAIN) IMPLANT
FACESHIELD WRAPAROUND (MASK) ×6 IMPLANT
GAUZE SPONGE 4X4 12PLY STRL (GAUZE/BANDAGES/DRESSINGS) ×3 IMPLANT
GAUZE XEROFORM 5X9 LF (GAUZE/BANDAGES/DRESSINGS) ×3 IMPLANT
GLOVE BIOGEL PI IND STRL 8 (GLOVE) ×2 IMPLANT
GLOVE BIOGEL PI INDICATOR 8 (GLOVE) ×4
GLOVE ORTHO TXT STRL SZ7.5 (GLOVE) ×6 IMPLANT
GOWN STRL REUS W/ TWL LRG LVL3 (GOWN DISPOSABLE) ×1 IMPLANT
GOWN STRL REUS W/ TWL XL LVL3 (GOWN DISPOSABLE) ×1 IMPLANT
GOWN STRL REUS W/TWL 2XL LVL3 (GOWN DISPOSABLE) ×3 IMPLANT
GOWN STRL REUS W/TWL LRG LVL3 (GOWN DISPOSABLE) ×2
GOWN STRL REUS W/TWL XL LVL3 (GOWN DISPOSABLE) ×2
HANDPIECE INTERPULSE COAX TIP (DISPOSABLE) ×2
IMMOBILIZER KNEE 22 UNIV (SOFTGOODS) ×3 IMPLANT
KIT BASIN OR (CUSTOM PROCEDURE TRAY) ×3 IMPLANT
KIT TURNOVER KIT B (KITS) ×3 IMPLANT
MANIFOLD NEPTUNE II (INSTRUMENTS) ×3 IMPLANT
MARKER SKIN DUAL TIP RULER LAB (MISCELLANEOUS) ×3 IMPLANT
NEEDLE 18GX1X1/2 (RX/OR ONLY) (NEEDLE) ×3 IMPLANT
NEEDLE HYPO 25GX1X1/2 BEV (NEEDLE) ×3 IMPLANT
NS IRRIG 1000ML POUR BTL (IV SOLUTION) ×3 IMPLANT
PACK TOTAL JOINT (CUSTOM PROCEDURE TRAY) ×3 IMPLANT
PAD ARMBOARD 7.5X6 YLW CONV (MISCELLANEOUS) ×6 IMPLANT
PAD CAST 4YDX4 CTTN HI CHSV (CAST SUPPLIES) ×1 IMPLANT
PADDING CAST COTTON 4X4 STRL (CAST SUPPLIES) ×2
PADDING CAST COTTON 6X4 STRL (CAST SUPPLIES) ×3 IMPLANT
PATELLA MEDIAL ATTUN 35MM KNEE (Knees) ×3 IMPLANT
SET HNDPC FAN SPRY TIP SCT (DISPOSABLE) ×1 IMPLANT
STAPLER VISISTAT 35W (STAPLE) IMPLANT
SUCTION FRAZIER HANDLE 10FR (MISCELLANEOUS) ×2
SUCTION TUBE FRAZIER 10FR DISP (MISCELLANEOUS) ×1 IMPLANT
SUT VIC AB 0 CT1 27 (SUTURE) ×2
SUT VIC AB 0 CT1 27XBRD ANBCTR (SUTURE) ×1 IMPLANT
SUT VIC AB 1 CTX 36 (SUTURE) ×4
SUT VIC AB 1 CTX36XBRD ANBCTR (SUTURE) ×2 IMPLANT
SUT VIC AB 2-0 CT1 27 (SUTURE) ×4
SUT VIC AB 2-0 CT1 TAPERPNT 27 (SUTURE) ×2 IMPLANT
SUT VIC AB 3-0 X1 27 (SUTURE) ×3 IMPLANT
SYR 50ML LL SCALE MARK (SYRINGE) ×3 IMPLANT
SYR CONTROL 10ML LL (SYRINGE) ×3 IMPLANT
TOWEL GREEN STERILE (TOWEL DISPOSABLE) ×3 IMPLANT
TOWEL GREEN STERILE FF (TOWEL DISPOSABLE) ×3 IMPLANT
TRAY CATH 16FR W/PLASTIC CATH (SET/KITS/TRAYS/PACK) IMPLANT

## 2020-09-27 NOTE — Interval H&P Note (Signed)
History and Physical Interval Note:  09/27/2020 7:24 AM  Vanessa Pierce  has presented today for surgery, with the diagnosis of left knee osteoarthritis.  The various methods of treatment have been discussed with the patient and family. After consideration of risks, benefits and other options for treatment, the patient has consented to  Procedure(s): LEFT TOTAL KNEE ARTHROPLASTY-CEMENTED (Left) as a surgical intervention.  The patient's history has been reviewed, patient examined, no change in status, stable for surgery.  I have reviewed the patient's chart and labs.  Questions were answered to the patient's satisfaction.     Marybelle Killings

## 2020-09-27 NOTE — Anesthesia Postprocedure Evaluation (Signed)
Anesthesia Post Note  Patient: Vanessa Pierce  Procedure(s) Performed: LEFT TOTAL KNEE ARTHROPLASTY-CEMENTED (Left Knee)     Patient location during evaluation: PACU Anesthesia Type: Regional and Spinal Level of consciousness: oriented and awake and alert Pain management: pain level controlled Vital Signs Assessment: post-procedure vital signs reviewed and stable Respiratory status: spontaneous breathing, respiratory function stable and patient connected to nasal cannula oxygen Cardiovascular status: blood pressure returned to baseline and stable Postop Assessment: no headache, no backache and no apparent nausea or vomiting Anesthetic complications: no   No complications documented.  Last Vitals:  Vitals:   09/27/20 0607 09/27/20 0930  BP: 126/66   Pulse: 70   Resp: 18   Temp: 36.4 C 36.5 C  SpO2: 100%     Last Pain:  Vitals:   09/27/20 0930  TempSrc:   PainSc: 0-No pain                 Cole Klugh L Aleja Yearwood

## 2020-09-27 NOTE — Transfer of Care (Signed)
Immediate Anesthesia Transfer of Care Note  Patient: Zenya Hickam  Procedure(s) Performed: LEFT TOTAL KNEE ARTHROPLASTY-CEMENTED (Left Knee)  Patient Location: PACU  Anesthesia Type:MAC and Regional  Level of Consciousness: awake, alert  and oriented  Airway & Oxygen Therapy: Patient Spontanous Breathing  Post-op Assessment: Report given to RN and Post -op Vital signs reviewed and stable  Post vital signs: Reviewed and stable  Last Vitals:  Vitals Value Taken Time  BP 106/63 09/27/20 0930  Temp    Pulse 67 09/27/20 0935  Resp 18 09/27/20 0935  SpO2 100 % 09/27/20 0935  Vitals shown include unvalidated device data.  Last Pain:  Vitals:   09/27/20 0620  TempSrc:   PainSc: 6          Complications: No complications documented.

## 2020-09-27 NOTE — Op Note (Signed)
Preop diagnosis: Left knee primary osteoarthritis  Postop diagnosis: Same  Procedure: Left total knee arthroplasty.  Surgeon: Rodell Perna, MD  Assistant: Benjiman Core, PA-C medically necessary and present for the entire procedure  Anesthesia: Spinal plus preoperative abductor block plus Exparel and Marcaine 20+20 = 40.  Tourniquet: 350 x 40 minutes.  Implants:Depuy Attune size 4 femur size 4 tibia 5 mm rotating platform 35 mm 3 peg patella.  Procedure: After induction of spinal anesthesia with preoperative abductor block proximal thigh tourniquet heel bump lateral post TXA IV Ancef IV with positive MSSA on PCR.  DuraPrep was used and the tourniquet tips of the toes usual total knee sheets drapes sterile skin marker Betadine Steri-Drape were applied timeout procedure was completed.  Midline incision was made medial suprapatellar incision.  Patella was cut 10 mm.  Intramedullary drill in the femur and 9 mm resected distal cut 5degrees.  Chamfer cuts were made meniscal root remnants were resected patient had tricompartmental arthritis marginal osteophytes were removed.  AP cut was made on the tibia keel preparation also sized for an 5 mm spacer block would go all the way in the knee would come out to full extension felt slightly tight.  There is some posterior spurs on the femur medial lateral which were resected and small remnant of the PCL was left which was resected.  This allowed full extension with trials.  Lug nuts were drilled on the femur as well as the 35 3 peg patella.  Pulsatile lavage back to mixing of the cement.  Tibia cemented first followed by femur placement of prior permanent poly all excessive cement was removed.  There is good balanced collateral ligaments in flexion extension.  Exparel plus Marcaine was infiltrated with cement was setting up and was hard at 15 minutes tourniquet deflated hemostasis standard layered closure #1 Vicryl interrupted through the quad tendon and medial  retinacular incision 2 in the subtendinous tissue skin staple closure postop dressing and knee immobilizer.

## 2020-09-27 NOTE — Progress Notes (Signed)
Orthopedic Tech Progress Note Patient Details:  Vanessa Pierce 07/13/1951 [id number]  CPM Right Knee CPM Right Knee: On Right Knee Flexion (Degrees): 60 Right Knee Extension (Degrees): 0 Additional Comments: added ice  Post Interventions Patient Tolerated: Well Instructions Provided: Care of device  Janit Pagan 09/27/2020, 11:54 AM

## 2020-09-27 NOTE — Discharge Instructions (Signed)
INSTRUCTIONS AFTER JOINT REPLACEMENT   o Remove items at home which could result in a fall. This includes throw rugs or furniture in walking pathways o ICE to the affected joint every three hours while awake for 30 minutes at a time, for at least the first 3-5 days, and then as needed for pain and swelling.  Continue to use ice for pain and swelling. You may notice swelling that will progress down to the foot and ankle.  This is normal after surgery.  Elevate your leg when you are not up walking on it.   o Continue to use the breathing machine you got in the hospital (incentive spirometer) which will help keep your temperature down.  It is common for your temperature to cycle up and down following surgery, especially at night when you are not up moving around and exerting yourself.  The breathing machine keeps your lungs expanded and your temperature down.   DIET:  As you were doing prior to hospitalization, we recommend a well-balanced diet.  DRESSING / WOUND CARE / SHOWERING  You may change your dressing 3-5 days after surgery.  Then change the dressing every day with sterile gauze.  Please use good hand washing techniques before changing the dressing.  Do not use any lotions or creams on the incision until instructed by your surgeon. ok to shower 3 days postop.  No tub soaking.  Do not need to cover wound when showering.   ACTIVITY  o Increase activity slowly as tolerated, but follow the weight bearing instructions below.   o No driving for 6 weeks or until further direction given by your physician.  You cannot drive while taking narcotics.  o No lifting or carrying greater than 10 lbs. until further directed by your surgeon. o Avoid periods of inactivity such as sitting longer than an hour when not asleep. This helps prevent blood clots.  o You may return to work once you are authorized by your doctor.     WEIGHT BEARING   Weight bearing as tolerated with assist device (walker, cane,  etc) as directed, use it as long as suggested by your surgeon or therapist, typically at least 4-6 weeks.   EXERCISES  Results after joint replacement surgery are often greatly improved when you follow the exercise, range of motion and muscle strengthening exercises prescribed by your doctor. Safety measures are also important to protect the joint from further injury. Any time any of these exercises cause you to have increased pain or swelling, decrease what you are doing until you are comfortable again and then slowly increase them. If you have problems or questions, call your caregiver or physical therapist for advice.   Rehabilitation is important following a joint replacement. After just a few days of immobilization, the muscles of the leg can become weakened and shrink (atrophy).  These exercises are designed to build up the tone and strength of the thigh and leg muscles and to improve motion. Often times heat used for twenty to thirty minutes before working out will loosen up your tissues and help with improving the range of motion but do not use heat for the first two weeks following surgery (sometimes heat can increase post-operative swelling).   These exercises can be done on a training (exercise) mat, on the floor, on a table or on a bed. Use whatever works the best and is most comfortable for you.    Use music or television while you are exercising so that the exercises  are a pleasant break in your day. This will make your life better with the exercises acting as a break in your routine that you can look forward to.   Perform all exercises about fifteen times, three times per day or as directed.  You should exercise both the operative leg and the other leg as well.  Exercises include:   . Quad Sets - Tighten up the muscle on the front of the thigh (Quad) and hold for 5-10 seconds.   . Straight Leg Raises - With your knee straight (if you were given a brace, keep it on), lift the leg to 60  degrees, hold for 3 seconds, and slowly lower the leg.  Perform this exercise against resistance later as your leg gets stronger.  . Leg Slides: Lying on your back, slowly slide your foot toward your buttocks, bending your knee up off the floor (only go as far as is comfortable). Then slowly slide your foot back down until your leg is flat on the floor again.  Glenard Haring Wings: Lying on your back spread your legs to the side as far apart as you can without causing discomfort.  . Hamstring Strength:  Lying on your back, push your heel against the floor with your leg straight by tightening up the muscles of your buttocks.  Repeat, but this time bend your knee to a comfortable angle, and push your heel against the floor.  You may put a pillow under the heel to make it more comfortable if necessary.   A rehabilitation program following joint replacement surgery can speed recovery and prevent re-injury in the future due to weakened muscles. Contact your doctor or a physical therapist for more information on knee rehabilitation.    CONSTIPATION  Constipation is defined medically as fewer than three stools per week and severe constipation as less than one stool per week.  Even if you have a regular bowel pattern at home, your normal regimen is likely to be disrupted due to multiple reasons following surgery.  Combination of anesthesia, postoperative narcotics, change in appetite and fluid intake all can affect your bowels.   YOU MUST use at least one of the following options; they are listed in order of increasing strength to get the job done.  They are all available over the counter, and you may need to use some, POSSIBLY even all of these options:    Drink plenty of fluids (prune juice may be helpful) and high fiber foods Colace 100 mg by mouth twice a day  Senokot for constipation as directed and as needed Dulcolax (bisacodyl), take with full glass of water  Miralax (polyethylene glycol) once or twice a  day as needed.  If you have tried all these things and are unable to have a bowel movement in the first 3-4 days after surgery call either your surgeon or your primary doctor.    If you experience loose stools or diarrhea, hold the medications until you stool forms back up.  If your symptoms do not get better within 1 week or if they get worse, check with your doctor.  If you experience "the worst abdominal pain ever" or develop nausea or vomiting, please contact the office immediately for further recommendations for treatment.   ITCHING:  If you experience itching with your medications, try taking only a single pain pill, or even half a pain pill at a time.  You can also use Benadryl over the counter for itching or also to help with  sleep.   TED HOSE STOCKINGS:  Use stockings on both legs until for at least 2 weeks or as directed by physician office. They may be removed at night for sleeping.  MEDICATIONS:  See your medication summary on the "After Visit Summary" that nursing will review with you.  You may have some home medications which will be placed on hold until you complete the course of blood thinner medication.  It is important for you to complete the blood thinner medication as prescribed.  PRECAUTIONS:  If you experience chest pain or shortness of breath - call 911 immediately for transfer to the hospital emergency department.   If you develop a fever greater that 101 F, purulent drainage from wound, increased redness or drainage from wound, foul odor from the wound/dressing, or calf pain - CONTACT YOUR SURGEON.                                                   FOLLOW-UP APPOINTMENTS:  If you do not already have a post-op appointment, please call the office for an appointment to be seen by your surgeon.  Guidelines for how soon to be seen are listed in your "After Visit Summary", but are typically between 1-4 weeks after surgery.  OTHER INSTRUCTIONS:   Knee Replacement:  Do not place  pillow under knee, focus on keeping the knee straight while resting. CPM instructions: 0-90 degrees, 2 hours in the morning, 2 hours in the afternoon, and 2 hours in the evening. Place foam block, curve side up under heel at all times except when in CPM or when walking.  DO NOT modify, tear, cut, or change the foam block in any way.   DENTAL ANTIBIOTICS:  In most cases prophylactic antibiotics for Dental procdeures after total joint surgery are not necessary.  Exceptions are as follows:  1. History of prior total joint infection  2. Severely immunocompromised (Organ Transplant, cancer chemotherapy, Rheumatoid biologic meds such as Collins)  3. Poorly controlled diabetes (A1C &gt; 8.0, blood glucose over 200)  If you have one of these conditions, contact your surgeon for an antibiotic prescription, prior to your dental procedure.   MAKE SURE YOU:  . Understand these instructions.  . Get help right away if you are not doing well or get worse.    Thank you for letting us be a part of your medical care team.  It is a privilege we respect greatly.  We hope these instructions will help you stay on track for a fast and full recovery!

## 2020-09-27 NOTE — Anesthesia Procedure Notes (Signed)
Anesthesia Regional Block: Adductor canal block   Pre-Anesthetic Checklist: ,, timeout performed, Correct Patient, Correct Site, Correct Laterality, Correct Procedure, Correct Position, site marked, Risks and benefits discussed,  Surgical consent,  Pre-op evaluation,  At surgeon's request and post-op pain management  Laterality: Left  Prep: Maximum Sterile Barrier Precautions used, chloraprep       Needles:  Injection technique: Single-shot  Needle Type: Echogenic Stimulator Needle     Needle Length: 9cm  Needle Gauge: 22     Additional Needles:   Procedures:,,,, ultrasound used (permanent image in chart),,,,  Narrative:  Start time: 09/27/2020 7:15 AM End time: 09/27/2020 7:25 AM Injection made incrementally with aspirations every 5 mL.  Performed by: Personally  Anesthesiologist: Freddrick March, MD  Additional Notes: Monitors applied. No increased pain on injection. No increased resistance to injection. Injection made in 5cc increments. Good needle visualization. Patient tolerated procedure well.

## 2020-09-27 NOTE — Anesthesia Procedure Notes (Signed)
Spinal  Patient location during procedure: OR Start time: 09/27/2020 7:40 AM End time: 09/27/2020 7:50 AM Staffing Performed: anesthesiologist  Anesthesiologist: Freddrick March, MD Preanesthetic Checklist Completed: patient identified, IV checked, risks and benefits discussed, surgical consent, monitors and equipment checked, pre-op evaluation and timeout performed Spinal Block Patient position: sitting Prep: DuraPrep and site prepped and draped Patient monitoring: cardiac monitor, continuous pulse ox and blood pressure Approach: midline Location: L3-4 Injection technique: single-shot Needle Needle type: Quincke  Needle gauge: 22 G Needle length: 9 cm Assessment Sensory level: T6 Additional Notes Functioning IV was confirmed and monitors were applied. Sterile prep and drape, including hand hygiene and sterile gloves were used. The patient was positioned and the spine was prepped. The skin was anesthetized with lidocaine.  Free flow of clear CSF was obtained prior to injecting local anesthetic into the CSF.  The spinal needle aspirated freely following injection.  The needle was carefully withdrawn.  The patient tolerated the procedure well.

## 2020-09-27 NOTE — Evaluation (Signed)
Physical Therapy Evaluation Patient Details Name: Vanessa Pierce MRN: [id number] DOB: 1950-12-21 Today's Date: 09/27/2020   History of Present Illness  Pt is a 69 y/o female s/p L TKA. PMH includes DM, HTN, and osteoporosis.   Clinical Impression  Pt admitted secondary to problem above with deficits below. Pt requiring min to mod A to stand and transfer to recliner. Pt with L knee buckling, even with use of KI, so further mobility deferred. Educated about knee precautions. Reports she plans to d/c home with assist from her brother. Will continue to follow acutely to maximize functional mobility independence and safety.     Follow Up Recommendations Follow surgeon's recommendation for DC plan and follow-up therapies    Equipment Recommendations  None recommended by PT    Recommendations for Other Services       Precautions / Restrictions Precautions Precautions: Knee Precaution Booklet Issued: No Precaution Comments: Verbally reviewed knee precautions Restrictions Weight Bearing Restrictions: Yes LLE Weight Bearing: Weight bearing as tolerated      Mobility  Bed Mobility Overal bed mobility: Needs Assistance Bed Mobility: Supine to Sit     Supine to sit: Min assist     General bed mobility comments: Min A for LLE assist.     Transfers Overall transfer level: Needs assistance Equipment used: Rolling walker (2 wheeled) Transfers: Sit to/from [facility] Sit to Stand: Min assist Stand pivot transfers: Min assist;Mod assist       General transfer comment: Min A for lift assist and steadying to stand. Pt with L knee buckling, even with use of KI, therefore mobility limited to chair. Min to mod A for steadying.   Ambulation/Gait                Stairs            Wheelchair Mobility    Modified Rankin (Stroke Patients Only)       Balance Overall balance assessment: Needs assistance Sitting-balance support: No upper extremity  supported;Feet supported Sitting balance-Leahy Scale: Good     Standing balance support: Bilateral upper extremity supported Standing balance-Leahy Scale: Poor Standing balance comment: Reliant on BUE support                              Pertinent Vitals/Pain Pain Assessment: Faces Faces Pain Scale: Hurts little more Pain Location: L knee Pain Descriptors / Indicators: Burning;Operative site guarding Pain Intervention(s): Limited activity within patient's tolerance;Monitored during session;Repositioned    Home Living Family/patient expects to be discharged to:: Private residence Living Arrangements: Other relatives (brother) Available Help at Discharge: Family Type of Home: House Home Access: Stairs to enter Entrance Stairs-Rails: None Entrance Stairs-Number of Steps: 2 Home Layout: One level Home Equipment: Environmental consultant - 2 wheels;Walker - 4 wheels;Cane - single point;Shower seat;Bedside commode      Prior Function Level of Independence: Independent with assistive device(s)         Comments: Was using cane for ambulation      Hand Dominance        Extremity/Trunk Assessment   Upper Extremity Assessment Upper Extremity Assessment: Overall WFL for tasks assessed    Lower Extremity Assessment Lower Extremity Assessment: LLE deficits/detail LLE Deficits / Details: Deficits consistent with post op pain and weakness. Knee instability noted even with use of KI       Communication   Communication: No difficulties  Cognition Arousal/Alertness: Awake/alert Behavior During Therapy: WFL for tasks assessed/performed  Overall Cognitive Status: Within Functional Limits for tasks assessed                                        General Comments      Exercises     Assessment/Plan    PT Assessment Patient needs continued PT services  PT Problem List Decreased strength;Decreased balance;Decreased activity tolerance;Decreased mobility;Decreased  range of motion;Decreased coordination;Decreased knowledge of use of DME;Decreased knowledge of precautions       PT Treatment Interventions Gait training;DME instruction;Therapeutic activities;Functional mobility training;Stair training;Balance training;Therapeutic exercise;Patient/family education    PT Goals (Current goals can be found in the Care Plan section)  Acute Rehab PT Goals Patient Stated Goal: to go home PT Goal Formulation: With patient Time For Goal Achievement: 10/11/20 Potential to Achieve Goals: Good    Frequency 7X/week   Barriers to discharge        Co-evaluation               AM-PAC PT "6 Clicks" Mobility  Outcome Measure Help needed turning from your back to your side while in a flat bed without using bedrails?: A Little Help needed moving from lying on your back to sitting on the side of a flat bed without using bedrails?: A Little Help needed moving to and from a bed to a chair (including a wheelchair)?: A Lot Help needed standing up from a chair using your arms (e.g., wheelchair or bedside chair)?: A Little Help needed to walk in hospital room?: A Lot Help needed climbing 3-5 steps with a railing? : Total 6 Click Score: 14    End of Session Equipment Utilized During Treatment: Gait belt;Left knee immobilizer Activity Tolerance: Patient tolerated treatment well Patient left: in chair;with call bell/phone within reach;Other (comment) (pharmacy tech in room ) Nurse Communication: Mobility status PT Visit Diagnosis: Unsteadiness on feet (R26.81);Muscle weakness (generalized) (M62.81);Difficulty in walking, not elsewhere classified (R26.2)    Time: 2979-8921 PT Time Calculation (min) (ACUTE ONLY): 17 min   Charges:   PT Evaluation $PT Eval Low Complexity: 1 Low          Lou Miner, DPT  Acute Rehabilitation Services  Pager: [phone] Office: [phone]   Rudean Hitt 09/27/2020, 3:13 PM

## 2020-09-27 NOTE — Care Plan (Signed)
Ortho Bundle Case Management Note  Patient Details  Name: Vanessa Pierce MRN: [id number] Date of Birth: 1951-06-22   RNCM call to patient to discuss her upcoming Left total knee replacement with Dr. Lorin Mercy. She is an Ortho bundle patient through [facility] and is agreeable to case management. Reviewed all post-op care instructions. Patient lives with her brother and has a friend and several family members that will be assisting after surgery. She has all DME needed (FWW, BSC/3in1, shower chair, cane). Choice provided and referral made to [facility] for [place] after discharge. Anticipate her to begin OPPT at approximately 2 weeks post-op. She would like somewhere near her home. RNCM will schedule this initial visit for her. Will continue to follow for needs.          DME Arranged:   (Patient states she has a FWW, handrails/grab bars in bathroom, shower seat, BSC, cane; no DME needs) DME Agency:     HH Arranged:  [place]:  [place] (now [facility])  Additional Comments: Please contact me with any questions of if this plan should need to change.  Jamse Arn, RN, BSN, [employer]  [phone] 09/27/2020, 11:30 AM

## 2020-09-28 DIAGNOSIS — Z794 Long term (current) use of insulin: Secondary | ICD-10-CM | POA: Diagnosis not present

## 2020-09-28 DIAGNOSIS — Z7982 Long term (current) use of aspirin: Secondary | ICD-10-CM | POA: Diagnosis not present

## 2020-09-28 DIAGNOSIS — I10 Essential (primary) hypertension: Secondary | ICD-10-CM | POA: Diagnosis not present

## 2020-09-28 DIAGNOSIS — M1712 Unilateral primary osteoarthritis, left knee: Secondary | ICD-10-CM | POA: Diagnosis not present

## 2020-09-28 DIAGNOSIS — Z79899 Other long term (current) drug therapy: Secondary | ICD-10-CM | POA: Diagnosis not present

## 2020-09-28 DIAGNOSIS — E119 Type 2 diabetes mellitus without complications: Secondary | ICD-10-CM | POA: Diagnosis not present

## 2020-09-28 DIAGNOSIS — Z7984 Long term (current) use of oral hypoglycemic drugs: Secondary | ICD-10-CM | POA: Diagnosis not present

## 2020-09-28 LAB — BASIC METABOLIC PANEL
Anion gap: 10 (ref 5–15)
BUN: 13 mg/dL (ref 8–23)
CO2: 26 mmol/L (ref 22–32)
Calcium: 8.9 mg/dL (ref 8.9–10.3)
Chloride: 106 mmol/L (ref 98–111)
Creatinine, Ser: 1.07 mg/dL — ABNORMAL HIGH (ref 0.44–1.00)
GFR, Estimated: 56 mL/min — ABNORMAL LOW (ref >60)
Glucose, Bld: 153 mg/dL — ABNORMAL HIGH (ref 70–99)
Potassium: 3.6 mmol/L (ref 3.5–5.1)
Sodium: 142 mmol/L (ref 135–145)

## 2020-09-28 LAB — CBC
HCT: 34.9 % — ABNORMAL LOW (ref 36.0–46.0)
Hemoglobin: 11.3 g/dL — ABNORMAL LOW (ref 12.0–15.0)
MCH: 27.7 pg (ref 26.0–34.0)
MCHC: 32.4 g/dL (ref 30.0–36.0)
MCV: 85.5 fL (ref 80.0–100.0)
Platelets: 284 10*3/uL (ref 150–400)
RBC: 4.08 MIL/uL (ref 3.87–5.11)
RDW: 12.8 % (ref 11.5–15.5)
WBC: 8.6 10*3/uL (ref 4.0–10.5)
nRBC: 0 % (ref 0.0–0.2)

## 2020-09-28 LAB — GLUCOSE, CAPILLARY
Glucose-Capillary: 156 mg/dL — ABNORMAL HIGH (ref 70–99)
Glucose-Capillary: 155 mg/dL — ABNORMAL HIGH (ref 70–99)

## 2020-09-28 NOTE — Progress Notes (Signed)
Physical Therapy Treatment Patient Details Name: Vanessa Pierce MRN: [id number] DOB: 17-Feb-1951 Today's Date: 09/28/2020    History of Present Illness Pt is a 69 y/o female s/p L TKA. PMH includes DM, HTN, and osteoporosis.     PT Comments    Pt with decreased pain and no signs or symptoms of dizziness.  Pt tolerated increased gt training, stair training and review of HEP.  HEP issued and pt ready to d/c home.     Follow Up Recommendations  Follow surgeon's recommendation for DC plan and follow-up therapies     Equipment Recommendations  None recommended by PT    Recommendations for Other Services       Precautions / Restrictions Precautions Precautions: Knee Precaution Booklet Issued: No Precaution Comments: Verbally reviewed knee precautions Restrictions Weight Bearing Restrictions: Yes LLE Weight Bearing: Weight bearing as tolerated    Mobility  Bed Mobility Overal bed mobility: Needs Assistance Bed Mobility: Supine to Sit     Supine to sit: Min assist     General bed mobility comments: Pt seated on commode on arrival.  Transfers Overall transfer level: Needs assistance Equipment used: Rolling walker (2 wheeled) Transfers: Sit to/from Stand Sit to Stand: Min guard         General transfer comment: Pt required decreased assistance to rise from commode and recliner.  Ambulation/Gait Ambulation/Gait assistance: Min guard Gait Distance (Feet): 100 Feet Assistive device: Rolling walker (2 wheeled) Gait Pattern/deviations: Step-to pattern;Step-through pattern;Trunk flexed;Decreased stance time - left     General Gait Details: Cues for gt symmetry and increasing weight bearing on L side.   Stairs Stairs: Yes Stairs assistance: Min assist Stair Management: No rails;Backwards;Forwards;With walker Number of Stairs: 2 General stair comments: Cues for sequencing with RW placement. Pt tolerated well.  Min assistance to guard RW.   Wheelchair  Mobility    Modified Rankin (Stroke Patients Only)       Balance Overall balance assessment: Needs assistance Sitting-balance support: No upper extremity supported;Feet supported Sitting balance-Leahy Scale: Good       Standing balance-Leahy Scale: Fair Standing balance comment: Reliant on BUE support                             Cognition Arousal/Alertness: Awake/alert Behavior During Therapy: WFL for tasks assessed/performed Overall Cognitive Status: Within Functional Limits for tasks assessed                                        Exercises Total Joint Exercises Ankle Circles/Pumps: AROM;Both;10 reps;Supine Quad Sets: AROM;10 reps;Supine;Left Heel Slides: AROM;Left;10 reps;Supine;AAROM Hip ABduction/ADduction: AROM;Left;10 reps;Supine;AAROM Straight Leg Raises: AAROM;Left;10 reps;Supine Goniometric ROM: 9-73 degrees L knee.    General Comments        Pertinent Vitals/Pain Pain Assessment: 0-10 Pain Score: 2  Pain Location: L knee Pain Descriptors / Indicators: Burning;Operative site guarding Pain Intervention(s): Monitored during session;Repositioned    Home Living                      Prior Function            PT Goals (current goals can now be found in the care plan section) Acute Rehab PT Goals Patient Stated Goal: to go home Potential to Achieve Goals: Good Progress towards PT goals: Progressing toward goals    Frequency  7X/week      PT Plan Current plan remains appropriate    Co-evaluation              AM-PAC PT "6 Clicks" Mobility   Outcome Measure  Help needed turning from your back to your side while in a flat bed without using bedrails?: A Little Help needed moving from lying on your back to sitting on the side of a flat bed without using bedrails?: A Little Help needed moving to and from a bed to a chair (including a wheelchair)?: A Little Help needed standing up from a chair using your  arms (e.g., wheelchair or bedside chair)?: A Little Help needed to walk in hospital room?: A Little Help needed climbing 3-5 steps with a railing? : A Little 6 Click Score: 18    End of Session Equipment Utilized During Treatment: Gait belt Activity Tolerance: Patient tolerated treatment well Patient left: in chair;with call bell/phone within reach;Other (comment) Nurse Communication: Mobility status PT Visit Diagnosis: Unsteadiness on feet (R26.81);Muscle weakness (generalized) (M62.81);Difficulty in walking, not elsewhere classified (R26.2)     Time: 5797-2820 PT Time Calculation (min) (ACUTE ONLY): 28 min  Charges:  $Gait Training: 8-22 mins $Therapeutic Exercise: 8-22 mins $Therapeutic Activity: 8-22 mins                     Erasmo Leventhal , PTA Acute Rehabilitation Services Pager [phone] Office [phone]     Vanessa Pierce 09/28/2020, 1:27 PM

## 2020-09-28 NOTE — Plan of Care (Signed)

## 2020-09-28 NOTE — Progress Notes (Signed)
Discharge summary provided to pt with instructions. Pt verbalized understanding of instructions with no complaints voiced.Per pt a friend of her would be responsible for her ride back home.

## 2020-09-28 NOTE — Care Management Obs Status (Signed)
[place] NOTIFICATION   Patient Details  Name: [place] Lesure MRN: [id number] Date of Birth: 1951-06-23   Medicare Observation Status Notification Given:  Yes    Joanne Chars, LCSW 09/28/2020, 10:30 AM

## 2020-09-28 NOTE — Discharge Summary (Signed)
Discharge Diagnoses:  Active Problems:   Unilateral primary osteoarthritis, left knee   Arthritis of right knee   Surgeries: Procedure(s): LEFT TOTAL KNEE ARTHROPLASTY-CEMENTED on 09/27/2020    Consultants:   Discharged Condition: Improved  Hospital Course: Tyja Anglea Gordner is an 69 y.o. female who was admitted 09/27/2020 with a chief complaint of osteoarthritis left knee, with a final diagnosis of left knee osteoarthritis.  Patient was brought to the operating room on 09/27/2020 and underwent Procedure(s): LEFT TOTAL KNEE ARTHROPLASTY-CEMENTED.    Patient was given perioperative antibiotics:  Anti-infectives (From admission, onward)   Start     Dose/Rate Route Frequency Ordered Stop   09/27/20 0615  ceFAZolin (ANCEF) IVPB 2g/100 mL premix        2 g 200 mL/hr over 30 Minutes Intravenous On call to O.R. 09/27/20 8657 09/27/20 0740    .  Patient was given sequential compression devices, early ambulation, and aspirin for DVT prophylaxis.  Recent vital signs:  Patient Vitals for the past 24 hrs:  BP Temp Temp src Pulse Resp SpO2  09/28/20 0900 123/72 98 F (36.7 C) Oral 88 17 97 %  09/28/20 0008 106/69 99 F (37.2 C) Oral 81 16 97 %  09/27/20 2015 (!) 122/58 98.6 F (37 C) Oral 90 18 100 %  09/27/20 1557 109/70 98 F (36.7 C) Oral 68 18 99 %  09/27/20 1229 103/64 97.6 F (36.4 C) Oral 63 17 99 %  09/27/20 1136 98/62 (!) 97.4 F (36.3 C) Oral (!) 56 17 99 %  09/27/20 1105 104/68 -- -- 63 17 100 %  09/27/20 1050 113/69 (!) 97 F (36.1 C) -- 66 (!) 21 100 %  .  Recent laboratory studies: DG Knee 1-2 Views Left  Result Date: 09/27/2020 CLINICAL DATA:  Status post total knee replacement. EXAM: LEFT KNEE - 1-2 VIEW COMPARISON:  None. FINDINGS: Sequelae of total knee arthroplasty are identified. The prosthetic components appear normally aligned, and no acute fracture is identified. There is a small well corticated ossicle adjacent to the lateral femoral condyle.  Postoperative gas is noted in the knee joint and surrounding soft tissues, and skin staples are in place anteriorly. IMPRESSION: Left total knee arthroplasty without evidence of acute osseous abnormality. Electronically Signed   By: Logan Bores M.D.   On: 09/27/2020 10:56    Discharge Medications:   Allergies as of 09/28/2020   No Known Allergies     Medication List    TAKE these medications   alendronate 70 MG tablet Commonly known as: FOSAMAX Take 70 mg by mouth once a week. Mondays   amLODipine 10 MG tablet Commonly known as: NORVASC Take 10 mg by mouth daily.   aspirin EC 325 MG tablet Take 1 tablet (325 mg total) by mouth daily. Must take at least 4 weeks postop for DVT prophylaxis. What changed:   medication strength  how much to take  additional instructions   calcium-vitamin D 500-200 MG-UNIT tablet Commonly known as: OSCAL WITH D Take 1 tablet by mouth daily with breakfast.   gabapentin 100 MG capsule Commonly known as: NEURONTIN Take 100 mg by mouth 3 (three) times daily.   glipiZIDE 5 MG tablet Commonly known as: GLUCOTROL Take 5 mg by mouth 2 (two) times daily.   insulin NPH Human 100 UNIT/ML injection Commonly known as: NOVOLIN N Inject 10 Units into the skin 2 (two) times daily after a meal.   insulin regular 100 units/mL injection Commonly known as: NOVOLIN R Inject 4-8  Units into the skin in the morning and at bedtime. Sliding scale depending on sugar   lisinopril-hydrochlorothiazide 20-25 MG tablet Commonly known as: ZESTORETIC Take 1 tablet by mouth daily.   Magnesium 250 MG Tabs Take 250 mg by mouth daily.   metFORMIN 1000 MG tablet Commonly known as: GLUCOPHAGE Take 1,000 mg by mouth 2 (two) times daily.   methocarbamol 500 MG tablet Commonly known as: Robaxin Take 1 tablet (500 mg total) by mouth every 6 (six) hours as needed for muscle spasms.   oxyCODONE-acetaminophen 5-325 MG tablet Commonly known as: PERCOCET/ROXICET Take 1  tablet by mouth every 4 (four) hours as needed for severe pain.   rosuvastatin 20 MG tablet Commonly known as: CRESTOR Take 1 tablet (20 mg total) by mouth daily.   topiramate 25 MG tablet Commonly known as: TOPAMAX Take 25 mg by mouth 2 (two) times daily.   traZODone 50 MG tablet Commonly known as: DESYREL Take 50 mg by mouth at bedtime as needed for sleep.       Diagnostic Studies: DG Knee 1-2 Views Left  Result Date: 09/27/2020 CLINICAL DATA:  Status post total knee replacement. EXAM: LEFT KNEE - 1-2 VIEW COMPARISON:  None. FINDINGS: Sequelae of total knee arthroplasty are identified. The prosthetic components appear normally aligned, and no acute fracture is identified. There is a small well corticated ossicle adjacent to the lateral femoral condyle. Postoperative gas is noted in the knee joint and surrounding soft tissues, and skin staples are in place anteriorly. IMPRESSION: Left total knee arthroplasty without evidence of acute osseous abnormality. Electronically Signed   By: Logan Bores M.D.   On: 09/27/2020 10:56    Patient benefited maximally from their hospital stay and there were no complications.     Disposition: Discharge disposition: 01-Home or Self Care      Discharge Instructions    Call MD / Call 911   Complete by: As directed    If you experience chest pain or shortness of breath, CALL 911 and be transported to the hospital emergency room.  If you develope a fever above 101 F, pus (white drainage) or increased drainage or redness at the wound, or calf pain, call your surgeon's office.   Call MD / Call 911   Complete by: As directed    If you experience chest pain or shortness of breath, CALL 911 and be transported to the hospital emergency room.  If you develope a fever above 101 F, pus (white drainage) or increased drainage or redness at the wound, or calf pain, call your surgeon's office.   Constipation Prevention   Complete by: As directed    Drink  plenty of fluids.  Prune juice may be helpful.  You may use a stool softener, such as Colace (over the counter) 100 mg twice a day.  Use MiraLax (over the counter) for constipation as needed.   Constipation Prevention   Complete by: As directed    Drink plenty of fluids.  Prune juice may be helpful.  You may use a stool softener, such as Colace (over the counter) 100 mg twice a day.  Use MiraLax (over the counter) for constipation as needed.   Diet - low sodium heart healthy   Complete by: As directed    Diet - low sodium heart healthy   Complete by: As directed    Increase activity slowly as tolerated   Complete by: As directed    Increase activity slowly as tolerated   Complete by:  As directed       Follow-up Information    Marybelle Killings, MD. Go on 10/10/2020.   Specialty: Orthopedic Surgery Why: at 10:00 am in [place] office for your 2 week post-op appointment. [address]. [place], [place] Contact information: [place] [address] [phone]        Home, [facility] Follow up.   Specialty: Home Health Services Why: Someone from the home health agency will be in contact with you to arrange your first in home physical therapy appointment after discharge. Contact information: [address] [place] [address] [place] Follow up.   Specialty: Physical Therapy Why: Appointment is currently pending. NCM in office will arrange first visit for you. Contact information: [facility] [place] [address] [phone]                Signed: Newt Minion 09/28/2020, 10:41 AM

## 2020-09-28 NOTE — Progress Notes (Signed)
Physical Therapy Treatment Patient Details Name: Vanessa Pierce MRN: [id number] DOB: 03/18/1951 Today's Date: 09/28/2020    History of Present Illness Pt is a 69 y/o female s/p L TKA. PMH includes DM, HTN, and osteoporosis.     PT Comments    Pt supine in bed on arrival with urge to urinate.  Assisted patient to commode and progressed to gt after.  Pt reports mild dizziness during gt training and PTA assisted patient back to room and into recliner.  BP noticeably low (89/59).  Informed nursing. Will f/u in pm for stair training and HEP review before d/c home.     Follow Up Recommendations  Follow surgeon's recommendation for DC plan and follow-up therapies     Equipment Recommendations  None recommended by PT    Recommendations for Other Services       Precautions / Restrictions Precautions Precautions: Knee Precaution Booklet Issued: No Precaution Comments: Verbally reviewed knee precautions Restrictions Weight Bearing Restrictions: Yes LLE Weight Bearing: Weight bearing as tolerated    Mobility  Bed Mobility Overal bed mobility: Needs Assistance Bed Mobility: Supine to Sit     Supine to sit: Min assist     General bed mobility comments: Min A for LLE assist.   Transfers Overall transfer level: Needs assistance Equipment used: Rolling walker (2 wheeled) Transfers: Sit to/from Stand Sit to Stand: Min assist         General transfer comment: Cues for hand placement, foot placement and forward weight shifting to rise into standing.  Pt presents with poor eccentric load back to seated surface.  Ambulation/Gait Ambulation/Gait assistance: Min guard Gait Distance (Feet): 75 Feet Assistive device: Rolling walker (2 wheeled) Gait Pattern/deviations: Step-to pattern;Step-through pattern;Trunk flexed     General Gait Details: Cues for sequencing and posture awareness.  Pt reports feeling woozy but also reports she does not feel as if she will pass out.   Limited gt distance due to dizziness and returned to chair.  BP 89/59, reclined and re-took after 5 min 96/58.  Informed nursing.   Stairs             Wheelchair Mobility    Modified Rankin (Stroke Patients Only)       Balance Overall balance assessment: Needs assistance Sitting-balance support: No upper extremity supported;Feet supported Sitting balance-Leahy Scale: Good       Standing balance-Leahy Scale: Fair Standing balance comment: Reliant on BUE support                             Cognition Arousal/Alertness: Awake/alert Behavior During Therapy: WFL for tasks assessed/performed Overall Cognitive Status: Within Functional Limits for tasks assessed                                        Exercises Total Joint Exercises Ankle Circles/Pumps: AROM;Both;10 reps;Supine Goniometric ROM: 9-73 degrees L knee.    General Comments        Pertinent Vitals/Pain Pain Assessment: 0-10 Pain Score: 4  Pain Location: L knee Pain Descriptors / Indicators: Burning;Operative site guarding Pain Intervention(s): Monitored during session;Repositioned;Ice applied    Home Living                      Prior Function            PT Goals (current goals can now  be found in the care plan section) Acute Rehab PT Goals Patient Stated Goal: to go home Potential to Achieve Goals: Good Progress towards PT goals: Progressing toward goals    Frequency    7X/week      PT Plan Current plan remains appropriate    Co-evaluation              AM-PAC PT "6 Clicks" Mobility   Outcome Measure  Help needed turning from your back to your side while in a flat bed without using bedrails?: A Little Help needed moving from lying on your back to sitting on the side of a flat bed without using bedrails?: A Little Help needed moving to and from a bed to a chair (including a wheelchair)?: A Little Help needed standing up from a chair using your  arms (e.g., wheelchair or bedside chair)?: A Little Help needed to walk in hospital room?: A Little Help needed climbing 3-5 steps with a railing? : A Little 6 Click Score: 18    End of Session Equipment Utilized During Treatment: Gait belt Activity Tolerance: Patient tolerated treatment well Patient left: in chair;with call bell/phone within reach;Other (comment) Nurse Communication: Mobility status PT Visit Diagnosis: Unsteadiness on feet (R26.81);Muscle weakness (generalized) (M62.81);Difficulty in walking, not elsewhere classified (R26.2)     Time: 7471-8550 PT Time Calculation (min) (ACUTE ONLY): 27 min  Charges:  $Gait Training: 8-22 mins $Therapeutic Activity: 8-22 mins                     Erasmo Leventhal , PTA Acute Rehabilitation Services Pager [phone] Office [phone]     Vanessa Pierce Vanessa Pierce 09/28/2020, 10:23 AM

## 2020-09-28 NOTE — Progress Notes (Signed)
D/C pt to home as ordered. Pt remains alert/oriented in no apparent distress. Wheeled pt to with her personal belongings to her awaiting transport.

## 2020-09-28 NOTE — Plan of Care (Signed)
  Problem: Education: Goal: Knowledge of General Education information will improve Description: Including pain rating scale, medication(s)/side effects and non-pharmacologic comfort measures Outcome: Progressing   Problem: Health Behavior/Discharge Planning: Goal: Ability to manage health-related needs will improve Outcome: Progressing   Problem: Clinical Measurements: Goal: Ability to maintain clinical measurements within normal limits will improve Outcome: Progressing   Problem: Activity: Goal: Risk for activity intolerance will decrease Outcome: Progressing   Problem: Nutrition: Goal: Adequate nutrition will be maintained Outcome: Progressing   Problem: Coping: Goal: Level of anxiety will decrease Outcome: Progressing   Problem: Elimination: Goal: Will not experience complications related to urinary retention Outcome: Progressing   Problem: Pain Managment: Goal: General experience of comfort will improve Outcome: Progressing   

## 2020-09-29 DIAGNOSIS — I10 Essential (primary) hypertension: Secondary | ICD-10-CM | POA: Diagnosis not present

## 2020-09-29 DIAGNOSIS — Z471 Aftercare following joint replacement surgery: Secondary | ICD-10-CM | POA: Diagnosis not present

## 2020-09-29 DIAGNOSIS — E119 Type 2 diabetes mellitus without complications: Secondary | ICD-10-CM | POA: Diagnosis not present

## 2020-09-29 DIAGNOSIS — Z9181 History of falling: Secondary | ICD-10-CM | POA: Diagnosis not present

## 2020-09-29 DIAGNOSIS — E785 Hyperlipidemia, unspecified: Secondary | ICD-10-CM | POA: Diagnosis not present

## 2020-09-29 DIAGNOSIS — Z794 Long term (current) use of insulin: Secondary | ICD-10-CM | POA: Diagnosis not present

## 2020-09-29 DIAGNOSIS — M810 Age-related osteoporosis without current pathological fracture: Secondary | ICD-10-CM | POA: Diagnosis not present

## 2020-09-29 DIAGNOSIS — M1711 Unilateral primary osteoarthritis, right knee: Secondary | ICD-10-CM | POA: Diagnosis not present

## 2020-09-29 DIAGNOSIS — Z7984 Long term (current) use of oral hypoglycemic drugs: Secondary | ICD-10-CM | POA: Diagnosis not present

## 2020-09-29 DIAGNOSIS — Z96652 Presence of left artificial knee joint: Secondary | ICD-10-CM | POA: Diagnosis not present

## 2020-09-29 DIAGNOSIS — G43909 Migraine, unspecified, not intractable, without status migrainosus: Secondary | ICD-10-CM | POA: Diagnosis not present

## 2020-09-30 ENCOUNTER — Encounter (HOSPITAL_COMMUNITY): Payer: Self-pay | Admitting: Orthopaedic Surgery

## 2020-09-30 ENCOUNTER — Telehealth: Payer: Self-pay | Admitting: *Deleted

## 2020-09-30 NOTE — Telephone Encounter (Signed)
Ortho bundle D/C call completed. 

## 2020-10-02 DIAGNOSIS — I10 Essential (primary) hypertension: Secondary | ICD-10-CM | POA: Diagnosis not present

## 2020-10-02 DIAGNOSIS — E119 Type 2 diabetes mellitus without complications: Secondary | ICD-10-CM | POA: Diagnosis not present

## 2020-10-02 DIAGNOSIS — Z9181 History of falling: Secondary | ICD-10-CM | POA: Diagnosis not present

## 2020-10-02 DIAGNOSIS — M1711 Unilateral primary osteoarthritis, right knee: Secondary | ICD-10-CM | POA: Diagnosis not present

## 2020-10-02 DIAGNOSIS — M810 Age-related osteoporosis without current pathological fracture: Secondary | ICD-10-CM | POA: Diagnosis not present

## 2020-10-02 DIAGNOSIS — Z794 Long term (current) use of insulin: Secondary | ICD-10-CM | POA: Diagnosis not present

## 2020-10-02 DIAGNOSIS — E785 Hyperlipidemia, unspecified: Secondary | ICD-10-CM | POA: Diagnosis not present

## 2020-10-02 DIAGNOSIS — Z96652 Presence of left artificial knee joint: Secondary | ICD-10-CM | POA: Diagnosis not present

## 2020-10-02 DIAGNOSIS — G43909 Migraine, unspecified, not intractable, without status migrainosus: Secondary | ICD-10-CM | POA: Diagnosis not present

## 2020-10-02 DIAGNOSIS — Z7984 Long term (current) use of oral hypoglycemic drugs: Secondary | ICD-10-CM | POA: Diagnosis not present

## 2020-10-02 DIAGNOSIS — Z471 Aftercare following joint replacement surgery: Secondary | ICD-10-CM | POA: Diagnosis not present

## 2020-10-04 DIAGNOSIS — E785 Hyperlipidemia, unspecified: Secondary | ICD-10-CM | POA: Diagnosis not present

## 2020-10-04 DIAGNOSIS — G43909 Migraine, unspecified, not intractable, without status migrainosus: Secondary | ICD-10-CM | POA: Diagnosis not present

## 2020-10-04 DIAGNOSIS — E119 Type 2 diabetes mellitus without complications: Secondary | ICD-10-CM | POA: Diagnosis not present

## 2020-10-04 DIAGNOSIS — M1711 Unilateral primary osteoarthritis, right knee: Secondary | ICD-10-CM | POA: Diagnosis not present

## 2020-10-04 DIAGNOSIS — Z471 Aftercare following joint replacement surgery: Secondary | ICD-10-CM | POA: Diagnosis not present

## 2020-10-04 DIAGNOSIS — Z7984 Long term (current) use of oral hypoglycemic drugs: Secondary | ICD-10-CM | POA: Diagnosis not present

## 2020-10-04 DIAGNOSIS — I10 Essential (primary) hypertension: Secondary | ICD-10-CM | POA: Diagnosis not present

## 2020-10-04 DIAGNOSIS — Z794 Long term (current) use of insulin: Secondary | ICD-10-CM | POA: Diagnosis not present

## 2020-10-04 DIAGNOSIS — Z9181 History of falling: Secondary | ICD-10-CM | POA: Diagnosis not present

## 2020-10-04 DIAGNOSIS — M810 Age-related osteoporosis without current pathological fracture: Secondary | ICD-10-CM | POA: Diagnosis not present

## 2020-10-04 DIAGNOSIS — Z96652 Presence of left artificial knee joint: Secondary | ICD-10-CM | POA: Diagnosis not present

## 2020-10-07 ENCOUNTER — Telehealth: Payer: Self-pay | Admitting: Orthopaedic Surgery

## 2020-10-07 ENCOUNTER — Other Ambulatory Visit: Payer: Self-pay | Admitting: Orthopaedic Surgery

## 2020-10-07 DIAGNOSIS — M810 Age-related osteoporosis without current pathological fracture: Secondary | ICD-10-CM | POA: Diagnosis not present

## 2020-10-07 DIAGNOSIS — Z794 Long term (current) use of insulin: Secondary | ICD-10-CM | POA: Diagnosis not present

## 2020-10-07 DIAGNOSIS — I10 Essential (primary) hypertension: Secondary | ICD-10-CM | POA: Diagnosis not present

## 2020-10-07 DIAGNOSIS — E785 Hyperlipidemia, unspecified: Secondary | ICD-10-CM | POA: Diagnosis not present

## 2020-10-07 DIAGNOSIS — M1711 Unilateral primary osteoarthritis, right knee: Secondary | ICD-10-CM | POA: Diagnosis not present

## 2020-10-07 DIAGNOSIS — Z471 Aftercare following joint replacement surgery: Secondary | ICD-10-CM | POA: Diagnosis not present

## 2020-10-07 DIAGNOSIS — Z96652 Presence of left artificial knee joint: Secondary | ICD-10-CM | POA: Diagnosis not present

## 2020-10-07 DIAGNOSIS — Z9181 History of falling: Secondary | ICD-10-CM | POA: Diagnosis not present

## 2020-10-07 DIAGNOSIS — E119 Type 2 diabetes mellitus without complications: Secondary | ICD-10-CM | POA: Diagnosis not present

## 2020-10-07 DIAGNOSIS — Z7984 Long term (current) use of oral hypoglycemic drugs: Secondary | ICD-10-CM | POA: Diagnosis not present

## 2020-10-07 DIAGNOSIS — G43909 Migraine, unspecified, not intractable, without status migrainosus: Secondary | ICD-10-CM | POA: Diagnosis not present

## 2020-10-07 MED ORDER — OXYCODONE-ACETAMINOPHEN 5-325 MG PO TABS: 1.0000 | ORAL_TABLET | ORAL | 0 refills | Status: DC | PRN

## 2020-10-07 NOTE — Telephone Encounter (Signed)
Pt would like a refill on her pain medication  Pt would like it sent into [facility] in [place]  # [phone]

## 2020-10-07 NOTE — Telephone Encounter (Signed)
I called . done

## 2020-10-07 NOTE — Telephone Encounter (Signed)
Please advise 

## 2020-10-07 NOTE — Telephone Encounter (Signed)
noted 

## 2020-10-08 ENCOUNTER — Telehealth: Payer: Self-pay | Admitting: *Deleted

## 2020-10-08 ENCOUNTER — Other Ambulatory Visit: Payer: Self-pay | Admitting: *Deleted

## 2020-10-08 NOTE — Telephone Encounter (Signed)
Ortho bundle call completed. Fax also sent to [facility] in [place] for referral to [place]. Someone from that office will call her to schedule her initial eval, hopefully this week.

## 2020-10-09 DIAGNOSIS — Z96652 Presence of left artificial knee joint: Secondary | ICD-10-CM | POA: Diagnosis not present

## 2020-10-09 DIAGNOSIS — M810 Age-related osteoporosis without current pathological fracture: Secondary | ICD-10-CM | POA: Diagnosis not present

## 2020-10-09 DIAGNOSIS — Z471 Aftercare following joint replacement surgery: Secondary | ICD-10-CM | POA: Diagnosis not present

## 2020-10-09 DIAGNOSIS — Z9181 History of falling: Secondary | ICD-10-CM | POA: Diagnosis not present

## 2020-10-09 DIAGNOSIS — E119 Type 2 diabetes mellitus without complications: Secondary | ICD-10-CM | POA: Diagnosis not present

## 2020-10-09 DIAGNOSIS — I10 Essential (primary) hypertension: Secondary | ICD-10-CM | POA: Diagnosis not present

## 2020-10-09 DIAGNOSIS — E785 Hyperlipidemia, unspecified: Secondary | ICD-10-CM | POA: Diagnosis not present

## 2020-10-09 DIAGNOSIS — Z7984 Long term (current) use of oral hypoglycemic drugs: Secondary | ICD-10-CM | POA: Diagnosis not present

## 2020-10-09 DIAGNOSIS — G43909 Migraine, unspecified, not intractable, without status migrainosus: Secondary | ICD-10-CM | POA: Diagnosis not present

## 2020-10-09 DIAGNOSIS — M1711 Unilateral primary osteoarthritis, right knee: Secondary | ICD-10-CM | POA: Diagnosis not present

## 2020-10-09 DIAGNOSIS — Z794 Long term (current) use of insulin: Secondary | ICD-10-CM | POA: Diagnosis not present

## 2020-10-10 ENCOUNTER — Encounter: Payer: Self-pay | Admitting: Orthopaedic Surgery

## 2020-10-10 ENCOUNTER — Ambulatory Visit (INDEPENDENT_AMBULATORY_CARE_PROVIDER_SITE_OTHER): Payer: Medicare Other

## 2020-10-10 ENCOUNTER — Other Ambulatory Visit: Payer: Self-pay

## 2020-10-10 ENCOUNTER — Ambulatory Visit (INDEPENDENT_AMBULATORY_CARE_PROVIDER_SITE_OTHER): Payer: Medicare Other | Admitting: Orthopaedic Surgery

## 2020-10-10 VITALS — Ht 64.0 in | Wt 176.0 lb

## 2020-10-10 DIAGNOSIS — Z96652 Presence of left artificial knee joint: Secondary | ICD-10-CM | POA: Diagnosis not present

## 2020-10-10 NOTE — Progress Notes (Signed)
° °  Post-Op Visit Note   Patient: Vanessa Pierce           Date of Birth: 27-Jul-1951           MRN: [id number] Visit Date: 10/10/2020 PCP: Neale Burly, MD   Assessment & Plan: Post left total knee arthroplasty she just got her pain medication refilled and has plenty at this time.  She is done well with mild PT flexion and 90.  She is able to ambulate well with a walker transition and starting outpatient therapy next door.  Recheck 5 weeks.  She will call when she needs more pain medication we should be able to wean to Norco or Ultram.  She has only mild swelling is happy with the surgical result.  Chief Complaint:  Chief Complaint  Patient presents with   Left Knee - Routine Post Op    09/27/2020 left TKA   Visit Diagnoses:  1. Status post total left knee replacement     Plan: Staples removed Steri-Strips applied recheck 5 weeks.  Follow-Up Instructions: No follow-ups on file.   Orders:  Orders Placed This Encounter  Procedures   XR Knee 1-2 Views Left   No orders of the defined types were placed in this encounter.   Imaging: No results found.  PMFS History: Patient Active Problem List   Diagnosis Date Noted   Arthritis of right knee 09/27/2020   Unilateral primary osteoarthritis, left knee    Bilateral primary osteoarthritis of knee 07/04/2020   Past Medical History:  Diagnosis Date   Arthritis    Diabetes mellitus without complication ([place])    Hyperlipidemia    Hypertension    Migraines    Osteoporosis     Family History  Problem Relation Age of Onset   Pancreatic cancer Mother    Heart attack Brother     Past Surgical History:  Procedure Laterality Date   ABDOMINAL HYSTERECTOMY     KNEE ARTHROSCOPY Right    TOTAL KNEE ARTHROPLASTY Left 09/27/2020   Procedure: LEFT TOTAL KNEE ARTHROPLASTY-CEMENTED;  Surgeon: Marybelle Killings, MD;  Location: [facility];  Service: Orthopedics;  Laterality: Left;   TUBAL LIGATION     Social History    Occupational History   Occupation: retired   Tobacco Use   Smoking status: Never Smoker   Smokeless tobacco: Never Used  Scientific laboratory technician Use: Never used  Substance and Sexual Activity   Alcohol use: Yes    Comment: occasionally   Drug use: Never   Sexual activity: Not Currently

## 2020-10-11 ENCOUNTER — Telehealth: Payer: Self-pay | Admitting: *Deleted

## 2020-10-11 NOTE — Telephone Encounter (Signed)
Ortho bundle 14 day call completed. 

## 2020-10-14 DIAGNOSIS — M1712 Unilateral primary osteoarthritis, left knee: Secondary | ICD-10-CM | POA: Diagnosis not present

## 2020-10-17 ENCOUNTER — Telehealth: Payer: Self-pay | Admitting: Radiology

## 2020-10-17 ENCOUNTER — Other Ambulatory Visit: Payer: Self-pay | Admitting: Orthopaedic Surgery

## 2020-10-17 DIAGNOSIS — M1712 Unilateral primary osteoarthritis, left knee: Secondary | ICD-10-CM | POA: Diagnosis not present

## 2020-10-17 MED ORDER — HYDROCODONE-ACETAMINOPHEN 5-325 MG PO TABS
1.0000 | ORAL_TABLET | Freq: Four times a day (QID) | ORAL | 0 refills | Status: DC | PRN
Start: 2020-10-17 — End: 2022-01-08

## 2020-10-17 NOTE — Telephone Encounter (Signed)
Patient requests refill on pain medication be sent to [facility]. She states that you all discussed possibly a step down from Oxycodone.  CB for patient is [phone]

## 2020-10-17 NOTE — Telephone Encounter (Signed)
noted 

## 2020-10-17 NOTE — Telephone Encounter (Signed)
I sent it in [place] and called her. FYI

## 2020-10-21 DIAGNOSIS — M1712 Unilateral primary osteoarthritis, left knee: Secondary | ICD-10-CM | POA: Diagnosis not present

## 2020-10-24 DIAGNOSIS — M1712 Unilateral primary osteoarthritis, left knee: Secondary | ICD-10-CM | POA: Diagnosis not present

## 2020-10-28 DIAGNOSIS — M1712 Unilateral primary osteoarthritis, left knee: Secondary | ICD-10-CM | POA: Diagnosis not present

## 2020-10-29 ENCOUNTER — Telehealth: Payer: Self-pay | Admitting: *Deleted

## 2020-10-29 NOTE — Telephone Encounter (Signed)
30 day Ortho bundle call attempted. Left VM with patient requesting call back.

## 2020-10-30 ENCOUNTER — Telehealth: Payer: Self-pay | Admitting: *Deleted

## 2020-10-30 NOTE — Telephone Encounter (Signed)
2nd attempt at 30 day call . No answer and left VM with request to call back.

## 2020-10-31 DIAGNOSIS — M1712 Unilateral primary osteoarthritis, left knee: Secondary | ICD-10-CM | POA: Diagnosis not present

## 2020-11-04 DIAGNOSIS — M1712 Unilateral primary osteoarthritis, left knee: Secondary | ICD-10-CM | POA: Diagnosis not present

## 2020-11-07 ENCOUNTER — Other Ambulatory Visit: Payer: Self-pay | Admitting: Orthopaedic Surgery

## 2020-11-07 ENCOUNTER — Telehealth: Payer: Self-pay | Admitting: *Deleted

## 2020-11-07 MED ORDER — TRAMADOL HCL 50 MG PO TABS
50.0000 mg | ORAL_TABLET | Freq: Two times a day (BID) | ORAL | 0 refills | Status: DC | PRN
Start: 2020-11-07 — End: 2020-12-05

## 2020-11-07 NOTE — Telephone Encounter (Signed)
I called, ultram sent in . 0 to 113 degrees.

## 2020-11-07 NOTE — Telephone Encounter (Signed)
noted 

## 2020-11-07 NOTE — Telephone Encounter (Signed)
Patient called requesting refill of pain medication. Thanks.

## 2020-11-07 NOTE — Telephone Encounter (Signed)
Please advise 

## 2020-11-11 DIAGNOSIS — M1712 Unilateral primary osteoarthritis, left knee: Secondary | ICD-10-CM | POA: Diagnosis not present

## 2020-11-21 ENCOUNTER — Other Ambulatory Visit: Payer: Self-pay

## 2020-11-21 ENCOUNTER — Ambulatory Visit (INDEPENDENT_AMBULATORY_CARE_PROVIDER_SITE_OTHER): Payer: Medicare Other | Admitting: Orthopaedic Surgery

## 2020-11-21 ENCOUNTER — Encounter: Payer: Self-pay | Admitting: Orthopaedic Surgery

## 2020-11-21 DIAGNOSIS — Z96652 Presence of left artificial knee joint: Secondary | ICD-10-CM | POA: Insufficient documentation

## 2020-11-21 NOTE — Progress Notes (Signed)
   Post-Op Visit Note   Patient: Vanessa Pierce           Date of Birth: 13-Feb-1951           MRN: [id number] Visit Date: 11/21/2020 PCP: Neale Burly, MD   Assessment & Plan: Post left total knee arthroplasty 09/27/2020.  She states she completed therapy shows full extension passively but has a 5 degree extension lag flexion to 120 but still weak quad.  She is ambulating with a cane but Vanessa Pierce is off the walker.  Discontinue pain medication Ultram and muscle relaxants.  Continue quad strengthening which is her 1 area that needs to improve.  Her flexion and extension passively is great.  Recheck 2 months.  Chief Complaint:  Chief Complaint  Patient presents with  . Left Knee - Follow-up    09/27/2020 Left TKA   Visit Diagnoses:  1. S/P TKR (total knee replacement), left     Plan: Continue quad exercises we went over several.  Recheck 2 months.  Follow-Up Instructions: No follow-ups on file.   Orders:  No orders of the defined types were placed in this encounter.  No orders of the defined types were placed in this encounter.   Imaging: No results found.  PMFS History: Patient Active Problem List   Diagnosis Date Noted  . S/P TKR (total knee replacement), left 11/21/2020  . Arthritis of right knee 09/27/2020  . Bilateral primary osteoarthritis of knee 07/04/2020   Past Medical History:  Diagnosis Date  . Arthritis   . Diabetes mellitus without complication ([place])   . Hyperlipidemia   . Hypertension   . Migraines   . Osteoporosis     Family History  Problem Relation Age of Onset  . Pancreatic cancer Mother   . Heart attack Brother     Past Surgical History:  Procedure Laterality Date  . ABDOMINAL HYSTERECTOMY    . KNEE ARTHROSCOPY Right   . TOTAL KNEE ARTHROPLASTY Left 09/27/2020   Procedure: LEFT TOTAL KNEE ARTHROPLASTY-CEMENTED;  Surgeon: Marybelle Killings, MD;  Location: [facility];  Service: Orthopedics;  Laterality: Left;  . TUBAL LIGATION     Social  History   Occupational History  . Occupation: retired   Tobacco Use  . Smoking status: Never Smoker  . Smokeless tobacco: Never Used  Vaping Use  . Vaping Use: Never used  Substance and Sexual Activity  . Alcohol use: Yes    Comment: occasionally  . Drug use: Never  . Sexual activity: Not Currently

## 2020-12-03 ENCOUNTER — Telehealth: Payer: Self-pay | Admitting: *Deleted

## 2020-12-03 NOTE — Telephone Encounter (Signed)
Ortho bundle 60 day call.

## 2020-12-04 DIAGNOSIS — K219 Gastro-esophageal reflux disease without esophagitis: Secondary | ICD-10-CM | POA: Diagnosis not present

## 2020-12-04 DIAGNOSIS — I10 Essential (primary) hypertension: Secondary | ICD-10-CM | POA: Diagnosis not present

## 2020-12-04 DIAGNOSIS — M179 Osteoarthritis of knee, unspecified: Secondary | ICD-10-CM | POA: Diagnosis not present

## 2020-12-04 DIAGNOSIS — E1142 Type 2 diabetes mellitus with diabetic polyneuropathy: Secondary | ICD-10-CM | POA: Diagnosis not present

## 2020-12-04 DIAGNOSIS — M810 Age-related osteoporosis without current pathological fracture: Secondary | ICD-10-CM | POA: Diagnosis not present

## 2020-12-04 DIAGNOSIS — N3091 Cystitis, unspecified with hematuria: Secondary | ICD-10-CM | POA: Diagnosis not present

## 2020-12-04 DIAGNOSIS — E7849 Other hyperlipidemia: Secondary | ICD-10-CM | POA: Diagnosis not present

## 2020-12-05 ENCOUNTER — Other Ambulatory Visit: Payer: Self-pay

## 2020-12-05 ENCOUNTER — Ambulatory Visit (INDEPENDENT_AMBULATORY_CARE_PROVIDER_SITE_OTHER): Payer: Medicare Other | Admitting: Orthopaedic Surgery

## 2020-12-05 ENCOUNTER — Encounter: Payer: Self-pay | Admitting: Orthopaedic Surgery

## 2020-12-05 VITALS — Ht 64.0 in | Wt 170.0 lb

## 2020-12-05 DIAGNOSIS — Z96652 Presence of left artificial knee joint: Secondary | ICD-10-CM

## 2020-12-05 MED ORDER — LIDOCAINE HCL 1 % IJ SOLN
0.5000 mL | INTRAMUSCULAR | Status: AC | PRN
  Administered 2020-12-05: .5 mL

## 2020-12-05 MED ORDER — TRAMADOL HCL 50 MG PO TABS: 50.0000 mg | ORAL_TABLET | Freq: Two times a day (BID) | ORAL | 0 refills | Status: DC | PRN

## 2020-12-05 NOTE — Progress Notes (Signed)
Office Visit Note   Patient: Vanessa Pierce           Date of Birth: 04-02-1951           MRN: [id number] Visit Date: 12/05/2020              Requested by: Neale Burly, MD [place],  [place] [address] PCP: Neale Burly, MD   Assessment & Plan: Visit Diagnoses:  1. S/P TKR (total knee replacement), left     Plan: Quad weakness post total knee arthroplasty excellent been she has full extension. Knee aspirated 35 cc yellow serous fluid removed. Patient needs more therapy to strengthen her quad since she has an extension lag. Aspiration may help some with therapy pain and progress. Recheck 4 weeks.  Follow-Up Instructions: Return in about 4 years (around 12/05/2024).   Orders:  Orders Placed This Encounter  Procedures  . Ambulatory referral to Physical Therapy   Meds ordered this encounter  Medications  . traMADol (ULTRAM) 50 MG tablet    Sig: Take 1 tablet (50 mg total) by mouth every 12 (twelve) hours as needed.    Dispense:  30 tablet    Refill:  0      Procedures: Large Joint Inj: L knee on 12/05/2020 2:47 PM Indications: joint swelling and pain Details: 22 G 1.5 in needle, anterolateral approach  Arthrogram: No  Medications: 0.5 mL lidocaine 1 % Aspirate: 35 mL clear and yellow Outcome: tolerated well, no immediate complications Procedure, treatment alternatives, risks and benefits explained, specific risks discussed. Consent was given by the patient. Immediately prior to procedure a time out was called to verify the correct patient, procedure, equipment, support staff and site/side marked as required. Patient was prepped and draped in the usual sterile fashion.       Clinical Data: No additional findings.   Subjective: Chief Complaint  Patient presents with  . Left Knee - Pain    09/27/2020 Left TKA    HPI patient has a crouched knee gait with right quad weakness post total knee arthroplasty mid-October. Excellent flexion full  passive extension. 2-3+ knee effusion. Review of Systems updated unchanged.   Objective: Vital Signs: Ht 5\' 4"  (1.626 m)   Wt 170 lb (77.1 kg)   BMI 29.18 kg/m   Physical Exam no change other than knee effusion.  Ortho Exam well-healed knee incision. She has 5 to 10 degree extension lag weak quad I can overcome with 1 finger. 2-3+ knee effusion.  Specialty Comments:  No specialty comments available.  Imaging: No results found.   PMFS History: Patient Active Problem List   Diagnosis Date Noted  . S/P TKR (total knee replacement), left 11/21/2020  . Arthritis of right knee 09/27/2020  . Bilateral primary osteoarthritis of knee 07/04/2020   Past Medical History:  Diagnosis Date  . Arthritis   . Diabetes mellitus without complication ([place])   . Hyperlipidemia   . Hypertension   . Migraines   . Osteoporosis     Family History  Problem Relation Age of Onset  . Pancreatic cancer Mother   . Heart attack Brother     Past Surgical History:  Procedure Laterality Date  . ABDOMINAL HYSTERECTOMY    . KNEE ARTHROSCOPY Right   . TOTAL KNEE ARTHROPLASTY Left 09/27/2020   Procedure: LEFT TOTAL KNEE ARTHROPLASTY-CEMENTED;  Surgeon: Marybelle Killings, MD;  Location: [facility];  Service: Orthopedics;  Laterality: Left;  . TUBAL LIGATION  Social History   Occupational History  . Occupation: retired   Tobacco Use  . Smoking status: Never Smoker  . Smokeless tobacco: Never Used  Vaping Use  . Vaping Use: Never used  Substance and Sexual Activity  . Alcohol use: Yes    Comment: occasionally  . Drug use: Never  . Sexual activity: Not Currently

## 2020-12-09 DIAGNOSIS — M1712 Unilateral primary osteoarthritis, left knee: Secondary | ICD-10-CM | POA: Diagnosis not present

## 2020-12-11 ENCOUNTER — Telehealth: Payer: Self-pay | Admitting: *Deleted

## 2020-12-11 DIAGNOSIS — M1712 Unilateral primary osteoarthritis, left knee: Secondary | ICD-10-CM | POA: Diagnosis not present

## 2020-12-11 NOTE — Telephone Encounter (Signed)
Ucall. I sent in on 1/27 ultram one po bid # 30 which should last 15 days and now 6 days later she is out???

## 2020-12-11 NOTE — Telephone Encounter (Signed)
FYI  I spoke with patient. She states that she still has right much medication left and that she does not need a refill.

## 2020-12-11 NOTE — Telephone Encounter (Signed)
Patient called requesting refill of pain medication. Therapy going well.

## 2020-12-16 DIAGNOSIS — M1712 Unilateral primary osteoarthritis, left knee: Secondary | ICD-10-CM | POA: Diagnosis not present

## 2020-12-18 DIAGNOSIS — M1712 Unilateral primary osteoarthritis, left knee: Secondary | ICD-10-CM | POA: Diagnosis not present

## 2020-12-23 DIAGNOSIS — M1712 Unilateral primary osteoarthritis, left knee: Secondary | ICD-10-CM | POA: Diagnosis not present

## 2020-12-30 DIAGNOSIS — M1712 Unilateral primary osteoarthritis, left knee: Secondary | ICD-10-CM | POA: Diagnosis not present

## 2021-01-02 ENCOUNTER — Ambulatory Visit (INDEPENDENT_AMBULATORY_CARE_PROVIDER_SITE_OTHER): Payer: Medicare Other | Admitting: Orthopaedic Surgery

## 2021-01-02 ENCOUNTER — Other Ambulatory Visit: Payer: Self-pay

## 2021-01-02 ENCOUNTER — Encounter: Payer: Self-pay | Admitting: Orthopaedic Surgery

## 2021-01-02 VITALS — Ht 64.0 in | Wt 158.0 lb

## 2021-01-02 DIAGNOSIS — M1711 Unilateral primary osteoarthritis, right knee: Secondary | ICD-10-CM | POA: Diagnosis not present

## 2021-01-02 DIAGNOSIS — Z96652 Presence of left artificial knee joint: Secondary | ICD-10-CM | POA: Diagnosis not present

## 2021-01-02 NOTE — Progress Notes (Signed)
   Post-Op Visit Note   Patient: Vanessa Pierce           Date of Birth: 1951/02/11           MRN: [id number] Visit Date: 01/02/2021 PCP: Neale Burly, MD   Assessment & Plan: Post left total knee arthroplasty 11/28/2019.  Flexion is almost 120 she has full extension passively just as well extension lag and still walking with a cane we went over multiple exercises strengthen her quads I took fluid off her knee last visit in January and if she gets her quad stronger the problem will resolve.  Recheck 3 months.  Opposite right knee has significant arthritis but she needs to get her left knee strong before any further surgery is ever considered.  Chief Complaint:  Chief Complaint  Patient presents with  . Left Knee - Follow-up    09/27/2020 Left TKA   Visit Diagnoses:  1. S/P TKR (total knee replacement), left   2. Arthritis of right knee     Plan: recheck 3 months  Follow-Up Instructions: Return in about 3 months (around 04/01/2021).   Orders:  No orders of the defined types were placed in this encounter.  No orders of the defined types were placed in this encounter.   Imaging: No results found.  PMFS History: Patient Active Problem List   Diagnosis Date Noted  . S/P TKR (total knee replacement), left 11/21/2020  . Arthritis of right knee 09/27/2020  . Bilateral primary osteoarthritis of knee 07/04/2020   Past Medical History:  Diagnosis Date  . Arthritis   . Diabetes mellitus without complication ([place])   . Hyperlipidemia   . Hypertension   . Migraines   . Osteoporosis     Family History  Problem Relation Age of Onset  . Pancreatic cancer Mother   . Heart attack Brother     Past Surgical History:  Procedure Laterality Date  . ABDOMINAL HYSTERECTOMY    . KNEE ARTHROSCOPY Right   . TOTAL KNEE ARTHROPLASTY Left 09/27/2020   Procedure: LEFT TOTAL KNEE ARTHROPLASTY-CEMENTED;  Surgeon: Marybelle Killings, MD;  Location: [facility];  Service: Orthopedics;  Laterality:  Left;  . TUBAL LIGATION     Social History   Occupational History  . Occupation: retired   Tobacco Use  . Smoking status: Never Smoker  . Smokeless tobacco: Never Used  Vaping Use  . Vaping Use: Never used  Substance and Sexual Activity  . Alcohol use: Yes    Comment: occasionally  . Drug use: Never  . Sexual activity: Not Currently

## 2021-01-07 ENCOUNTER — Telehealth: Payer: Self-pay | Admitting: *Deleted

## 2021-01-07 NOTE — Telephone Encounter (Signed)
Attempted 90 day Ortho bundle call to patient. No answer and left VM.

## 2021-01-14 ENCOUNTER — Telehealth: Payer: Self-pay | Admitting: *Deleted

## 2021-01-14 NOTE — Telephone Encounter (Signed)
Ortho bundle 90 day call completed. 

## 2021-01-16 ENCOUNTER — Other Ambulatory Visit: Payer: Self-pay

## 2021-01-16 ENCOUNTER — Ambulatory Visit: Payer: Medicare Other | Admitting: Orthopaedic Surgery

## 2021-03-05 DIAGNOSIS — K219 Gastro-esophageal reflux disease without esophagitis: Secondary | ICD-10-CM | POA: Diagnosis not present

## 2021-03-05 DIAGNOSIS — M179 Osteoarthritis of knee, unspecified: Secondary | ICD-10-CM | POA: Diagnosis not present

## 2021-03-05 DIAGNOSIS — M810 Age-related osteoporosis without current pathological fracture: Secondary | ICD-10-CM | POA: Diagnosis not present

## 2021-03-05 DIAGNOSIS — N3091 Cystitis, unspecified with hematuria: Secondary | ICD-10-CM | POA: Diagnosis not present

## 2021-03-05 DIAGNOSIS — I10 Essential (primary) hypertension: Secondary | ICD-10-CM | POA: Diagnosis not present

## 2021-03-05 DIAGNOSIS — E1142 Type 2 diabetes mellitus with diabetic polyneuropathy: Secondary | ICD-10-CM | POA: Diagnosis not present

## 2021-03-05 DIAGNOSIS — E7849 Other hyperlipidemia: Secondary | ICD-10-CM | POA: Diagnosis not present

## 2021-03-05 DIAGNOSIS — Z0000 Encounter for general adult medical examination without abnormal findings: Secondary | ICD-10-CM | POA: Diagnosis not present

## 2021-03-08 DIAGNOSIS — E1142 Type 2 diabetes mellitus with diabetic polyneuropathy: Secondary | ICD-10-CM | POA: Diagnosis not present

## 2021-03-08 DIAGNOSIS — K219 Gastro-esophageal reflux disease without esophagitis: Secondary | ICD-10-CM | POA: Diagnosis not present

## 2021-03-08 DIAGNOSIS — E7849 Other hyperlipidemia: Secondary | ICD-10-CM | POA: Diagnosis not present

## 2021-03-08 DIAGNOSIS — I10 Essential (primary) hypertension: Secondary | ICD-10-CM | POA: Diagnosis not present

## 2021-03-27 ENCOUNTER — Ambulatory Visit (INDEPENDENT_AMBULATORY_CARE_PROVIDER_SITE_OTHER): Payer: Medicare Other | Admitting: Orthopaedic Surgery

## 2021-03-27 ENCOUNTER — Encounter: Payer: Self-pay | Admitting: Orthopaedic Surgery

## 2021-03-27 ENCOUNTER — Other Ambulatory Visit: Payer: Self-pay

## 2021-03-27 VITALS — Ht 64.0 in | Wt 157.0 lb

## 2021-03-27 DIAGNOSIS — Z96652 Presence of left artificial knee joint: Secondary | ICD-10-CM | POA: Diagnosis not present

## 2021-03-27 DIAGNOSIS — M6281 Muscle weakness (generalized): Secondary | ICD-10-CM | POA: Diagnosis not present

## 2021-03-27 NOTE — Progress Notes (Signed)
Office Visit Note   Patient: Vanessa Pierce           Date of Birth: 1951/05/11           MRN: [id number] Visit Date: 03/27/2021              Requested by: Neale Burly, MD [place],  [place] [address] PCP: Neale Burly, MD   Assessment & Plan: Visit Diagnoses:  1. S/P TKR (total knee replacement), left   2. Quadriceps weakness     Plan: She is made good progress but still needs to work on quad strengthening so that she can walk up stairs not having to use the handrail and help pull her self up.  She is much better than she was 2 months ago.  She can continue to work on this and we discussed that she continues to get better she will has less pain less swelling at the end of the day and less fatigue.  She asked about pain medication we discussed she needs to do her continue quad strengthening to take care of her pain problem and not use pills.  Follow-Up Instructions: Return if symptoms worsen or fail to improve.   Orders:  No orders of the defined types were placed in this encounter.  No orders of the defined types were placed in this encounter.     Procedures: No procedures performed   Clinical Data: No additional findings.   Subjective: Chief Complaint  Patient presents with  . Left Knee - Follow-up    09/27/2020 Left TKA    HPI postop total knee arthroplasty left November 2021.  She has been through 2 rounds of therapy due to residual quad weakness.  Still uses a hop step to get up on the exam stool on the left does better on the right.  She can on her leg intake can resistance with improved quad strength from previous visit.  She is ambulating without a limp today.  She still has swelling of her knee at the end of the day.  Review of Systems all other systems updated unchanged since surgery last winter.   Objective: Vital Signs: Ht 5\' 4"  (1.626 m)   Wt 157 lb (71.2 kg)   BMI 26.95 kg/m   Physical Exam Constitutional:       Appearance: She is well-developed.  HENT:     Head: Normocephalic.     Right Ear: External ear normal.     Left Ear: External ear normal.  Eyes:     Pupils: Pupils are equal, round, and reactive to light.  Neck:     Thyroid: No thyromegaly.     Trachea: No tracheal deviation.  Cardiovascular:     Rate and Rhythm: Normal rate.  Pulmonary:     Effort: Pulmonary effort is normal.  Abdominal:     Palpations: Abdomen is soft.  Skin:    General: Skin is warm and dry.  Neurological:     Mental Status: She is alert and oriented to person, place, and time.  Psychiatric:        Behavior: Behavior normal.     Ortho Exam good flexion good extension.  She takes really good hand resistive testing against her quad.  Still has 2+ knee effusion.  Still uses a hop step with increased hip flexion to get up on the exam stool with the left leg first and then extends her knee out to full extension.  Negative logroll of  the hips pulses are normal.  Specialty Comments:  No specialty comments available.  Imaging: No results found.   PMFS History: Patient Active Problem List   Diagnosis Date Noted  . Quadriceps weakness 03/27/2021  . S/P TKR (total knee replacement), left 11/21/2020  . Arthritis of right knee 09/27/2020  . Bilateral primary osteoarthritis of knee 07/04/2020   Past Medical History:  Diagnosis Date  . Arthritis   . Diabetes mellitus without complication ([place])   . Hyperlipidemia   . Hypertension   . Migraines   . Osteoporosis     Family History  Problem Relation Age of Onset  . Pancreatic cancer Mother   . Heart attack Brother     Past Surgical History:  Procedure Laterality Date  . ABDOMINAL HYSTERECTOMY    . KNEE ARTHROSCOPY Right   . TOTAL KNEE ARTHROPLASTY Left 09/27/2020   Procedure: LEFT TOTAL KNEE ARTHROPLASTY-CEMENTED;  Surgeon: Marybelle Killings, MD;  Location: [facility];  Service: Orthopedics;  Laterality: Left;  . TUBAL LIGATION     Social History    Occupational History  . Occupation: retired   Tobacco Use  . Smoking status: Never Smoker  . Smokeless tobacco: Never Used  Vaping Use  . Vaping Use: Never used  Substance and Sexual Activity  . Alcohol use: Yes    Comment: occasionally  . Drug use: Never  . Sexual activity: Not Currently

## 2021-05-08 DIAGNOSIS — E1142 Type 2 diabetes mellitus with diabetic polyneuropathy: Secondary | ICD-10-CM | POA: Diagnosis not present

## 2021-05-08 DIAGNOSIS — K219 Gastro-esophageal reflux disease without esophagitis: Secondary | ICD-10-CM | POA: Diagnosis not present

## 2021-05-08 DIAGNOSIS — E7849 Other hyperlipidemia: Secondary | ICD-10-CM | POA: Diagnosis not present

## 2021-05-08 DIAGNOSIS — I10 Essential (primary) hypertension: Secondary | ICD-10-CM | POA: Diagnosis not present

## 2021-06-05 DIAGNOSIS — K219 Gastro-esophageal reflux disease without esophagitis: Secondary | ICD-10-CM | POA: Diagnosis not present

## 2021-06-05 DIAGNOSIS — E7849 Other hyperlipidemia: Secondary | ICD-10-CM | POA: Diagnosis not present

## 2021-06-05 DIAGNOSIS — R002 Palpitations: Secondary | ICD-10-CM | POA: Diagnosis not present

## 2021-06-05 DIAGNOSIS — E1142 Type 2 diabetes mellitus with diabetic polyneuropathy: Secondary | ICD-10-CM | POA: Diagnosis not present

## 2021-06-05 DIAGNOSIS — M179 Osteoarthritis of knee, unspecified: Secondary | ICD-10-CM | POA: Diagnosis not present

## 2021-06-05 DIAGNOSIS — I10 Essential (primary) hypertension: Secondary | ICD-10-CM | POA: Diagnosis not present

## 2021-06-05 DIAGNOSIS — M810 Age-related osteoporosis without current pathological fracture: Secondary | ICD-10-CM | POA: Diagnosis not present

## 2021-06-08 DIAGNOSIS — M810 Age-related osteoporosis without current pathological fracture: Secondary | ICD-10-CM | POA: Diagnosis not present

## 2021-06-08 DIAGNOSIS — E7849 Other hyperlipidemia: Secondary | ICD-10-CM | POA: Diagnosis not present

## 2021-06-08 DIAGNOSIS — I10 Essential (primary) hypertension: Secondary | ICD-10-CM | POA: Diagnosis not present

## 2021-06-08 DIAGNOSIS — K219 Gastro-esophageal reflux disease without esophagitis: Secondary | ICD-10-CM | POA: Diagnosis not present

## 2021-06-08 DIAGNOSIS — E1142 Type 2 diabetes mellitus with diabetic polyneuropathy: Secondary | ICD-10-CM | POA: Diagnosis not present

## 2021-06-10 DIAGNOSIS — E162 Hypoglycemia, unspecified: Secondary | ICD-10-CM | POA: Diagnosis not present

## 2021-06-10 DIAGNOSIS — E161 Other hypoglycemia: Secondary | ICD-10-CM | POA: Diagnosis not present

## 2021-06-10 DIAGNOSIS — R410 Disorientation, unspecified: Secondary | ICD-10-CM | POA: Diagnosis not present

## 2021-07-09 DIAGNOSIS — E7849 Other hyperlipidemia: Secondary | ICD-10-CM | POA: Diagnosis not present

## 2021-07-09 DIAGNOSIS — E1142 Type 2 diabetes mellitus with diabetic polyneuropathy: Secondary | ICD-10-CM | POA: Diagnosis not present

## 2021-07-09 DIAGNOSIS — K219 Gastro-esophageal reflux disease without esophagitis: Secondary | ICD-10-CM | POA: Diagnosis not present

## 2021-07-09 DIAGNOSIS — I10 Essential (primary) hypertension: Secondary | ICD-10-CM | POA: Diagnosis not present

## 2021-07-09 DIAGNOSIS — M810 Age-related osteoporosis without current pathological fracture: Secondary | ICD-10-CM | POA: Diagnosis not present

## 2021-08-19 DIAGNOSIS — Z1231 Encounter for screening mammogram for malignant neoplasm of breast: Secondary | ICD-10-CM | POA: Diagnosis not present

## 2021-09-09 DIAGNOSIS — R002 Palpitations: Secondary | ICD-10-CM | POA: Diagnosis not present

## 2021-09-09 DIAGNOSIS — E1142 Type 2 diabetes mellitus with diabetic polyneuropathy: Secondary | ICD-10-CM | POA: Diagnosis not present

## 2021-09-09 DIAGNOSIS — E7849 Other hyperlipidemia: Secondary | ICD-10-CM | POA: Diagnosis not present

## 2021-09-09 DIAGNOSIS — I10 Essential (primary) hypertension: Secondary | ICD-10-CM | POA: Diagnosis not present

## 2021-09-09 DIAGNOSIS — M179 Osteoarthritis of knee, unspecified: Secondary | ICD-10-CM | POA: Diagnosis not present

## 2021-09-09 DIAGNOSIS — K219 Gastro-esophageal reflux disease without esophagitis: Secondary | ICD-10-CM | POA: Diagnosis not present

## 2021-09-09 DIAGNOSIS — Z0000 Encounter for general adult medical examination without abnormal findings: Secondary | ICD-10-CM | POA: Diagnosis not present

## 2021-09-09 DIAGNOSIS — M810 Age-related osteoporosis without current pathological fracture: Secondary | ICD-10-CM | POA: Diagnosis not present

## 2021-11-06 DIAGNOSIS — E11319 Type 2 diabetes mellitus with unspecified diabetic retinopathy without macular edema: Secondary | ICD-10-CM | POA: Diagnosis not present

## 2021-12-16 DIAGNOSIS — I10 Essential (primary) hypertension: Secondary | ICD-10-CM | POA: Diagnosis not present

## 2021-12-16 DIAGNOSIS — Z0000 Encounter for general adult medical examination without abnormal findings: Secondary | ICD-10-CM | POA: Diagnosis not present

## 2021-12-16 DIAGNOSIS — K219 Gastro-esophageal reflux disease without esophagitis: Secondary | ICD-10-CM | POA: Diagnosis not present

## 2021-12-16 DIAGNOSIS — M810 Age-related osteoporosis without current pathological fracture: Secondary | ICD-10-CM | POA: Diagnosis not present

## 2021-12-16 DIAGNOSIS — E1142 Type 2 diabetes mellitus with diabetic polyneuropathy: Secondary | ICD-10-CM | POA: Diagnosis not present

## 2021-12-16 DIAGNOSIS — E7849 Other hyperlipidemia: Secondary | ICD-10-CM | POA: Diagnosis not present

## 2021-12-16 DIAGNOSIS — M179 Osteoarthritis of knee, unspecified: Secondary | ICD-10-CM | POA: Diagnosis not present

## 2021-12-18 ENCOUNTER — Encounter: Payer: Self-pay | Admitting: Internal Medicine

## 2022-01-07 NOTE — Progress Notes (Signed)
? ?Referring Provider: Neale Burly, MD ?Primary Care Physician:  Neale Burly, MD ?Primary Gastroenterologist:  Dr. Abbey Chatters ? ?Chief Complaint  ?Patient presents with  ? Blood In Stools  ? ? ?HPI:   ?Vanessa Pierce is a 71 y.o. female presenting today at the request of Hasanaj, Samul Dada, MD for rectal bleeding.  ? ?Reviewed referral information.  Office visit with PCP on 12/16/2021.  Patient was doing well overall, but reported 2 episodes of bloody stools last week.  She was referred to GI. ? ?Today:  ?2 episode of bright red blood in stool and on tissue the first week of February. This was the first time this has ever happened. No recurrent symptoms.  No rectal pain or known hemorrhoids.  Last colonoscopy about 7 years ago in [facility] [place] she had a couple of polyps.  No family history of colon cancer. ? ?Denies constipation or diarrhea.  Bowels move daily to every other day.  Occasional mild lower abdominal discomfort just letting her know she needs to have a bowel movement.  No real bothersome abdominal pain.  ? ?Denies nausea, vomiting, reflux problems, or dysphagia.  ? ?All of her insulin is on a sliding scale.  ?Concerned about nausea and vomiting with colon prep.  ? ?Past Medical History:  ?Diagnosis Date  ? Arthritis   ? Diabetes mellitus without complication ([facility])   ? Hyperlipidemia   ? Hypertension   ? Migraines   ? Osteoporosis   ? ? ?Past Surgical History:  ?Procedure Laterality Date  ? ABDOMINAL HYSTERECTOMY    ? COLONOSCOPY    ? KNEE ARTHROSCOPY Right   ? TOTAL KNEE ARTHROPLASTY Left 09/27/2020  ? Procedure: LEFT TOTAL KNEE ARTHROPLASTY-CEMENTED;  Surgeon: Marybelle Killings, MD;  Location: [facility];  Service: Orthopedics;  Laterality: Left;  ? TUBAL LIGATION    ? ? ?Current Outpatient Medications  ?Medication Sig Dispense Refill  ? alendronate (FOSAMAX) 70 MG tablet Take 70 mg by mouth once a week. Mondays    ? amLODipine (NORVASC) 10 MG tablet Take 10 mg by mouth daily.     ? aspirin  EC 325 MG tablet Take 1 tablet (325 mg total) by mouth daily. Must take at least 4 weeks postop for DVT prophylaxis. 30 tablet 0  ? gabapentin (NEURONTIN) 100 MG capsule Take 100 mg by mouth 3 (three) times daily.    ? glipiZIDE (GLUCOTROL) 5 MG tablet Take 5 mg by mouth 2 (two) times daily.    ? insulin NPH Human (NOVOLIN N) 100 UNIT/ML injection Inject 10 Units into the skin 2 (two) times daily after a meal.     ? insulin regular (NOVOLIN R) 100 units/mL injection Inject 4-8 Units into the skin in the morning and at bedtime. Sliding scale depending on sugar    ? metFORMIN (GLUCOPHAGE) 1000 MG tablet Take 1,000 mg by mouth 2 (two) times daily.    ? ondansetron (ZOFRAN) 4 MG tablet Take 1 tablet (4 mg total) by mouth every 6-8 hours as needed for nausea or vomiting. 5 tablet 0  ? spironolactone (ALDACTONE) 50 MG tablet Take 50 mg by mouth daily.    ? traZODone (DESYREL) 50 MG tablet Take 50 mg by mouth at bedtime as needed for sleep.     ? rosuvastatin (CRESTOR) 20 MG tablet Take 1 tablet (20 mg total) by mouth daily. 90 tablet 3  ? ?No current facility-administered medications for this visit.  ? ? ?Allergies as of 01/08/2022  ? (  No Known Allergies)  ? ? ?Family History  ?Problem Relation Age of Onset  ? Pancreatic cancer Mother   ? Heart attack Brother   ? Colon cancer Neg Hx   ? ? ?Social History  ? ?Socioeconomic History  ? Marital status: Single  ?  Spouse name: Not on file  ? Number of children: Not on file  ? Years of education: Not on file  ? Highest education level: Not on file  ?Occupational History  ? Occupation: retired   ?Tobacco Use  ? Smoking status: Never  ? Smokeless tobacco: Never  ?Vaping Use  ? Vaping Use: Never used  ?Substance and Sexual Activity  ? Alcohol use: Yes  ?  Comment: occasionally  ? Drug use: Never  ? Sexual activity: Not Currently  ?Other Topics Concern  ? Not on file  ?Social History Narrative  ? Not on file  ? ?Social Determinants of Health  ? ?Financial Resource Strain: Not on  file  ?Food Insecurity: Not on file  ?Transportation Needs: Not on file  ?Physical Activity: Not on file  ?Stress: Not on file  ?Social Connections: Not on file  ?Intimate Partner Violence: Not on file  ? ? ?Review of Systems: ?Gen: Denies any fever, chills, cold or flulike symptoms, presyncope, syncope. ?CV: Denies chest pain, heart palpitations. ?Resp: Denies shortness of breath or cough. ?GI: See HPI ?GU : Denies urinary burning, urinary frequency, urinary hesitancy ?MS: Denies joint pain. ?Derm: Denies rash. ?Psych: Denies depression, anxiety. ?Heme: See HPI. ? ?Physical Exam: ?BP 124/68   Pulse 80   Temp (!) 97.4 ?F (36.3 ?C) (Temporal)   Ht 5\' 4"  (1.626 m)   Wt 181 lb 12.8 oz (82.5 kg)   BMI 31.21 kg/m?  ?General:   Alert and oriented. Pleasant and cooperative. Well-nourished and well-developed.  ?Head:  Normocephalic and atraumatic. ?Eyes:  Without icterus, sclera clear and conjunctiva pink.  ?Ears:  Normal auditory acuity. ?Lungs:  Clear to auscultation bilaterally. No wheezes, rales, or rhonchi. No distress.  ?Heart:  S1, S2 present without murmurs appreciated.  ?Abdomen:  +BS, soft, non-tender and non-distended. No HSM noted. No guarding or rebound. No masses appreciated.  ?Rectal:  Deferred  ?Msk:  Symmetrical without gross deformities. Normal posture. ?Extremities:  Without edema. ?Neurologic:  Alert and  oriented x4;  grossly normal neurologically. ?Skin:  Intact without significant lesions or rashes. ?Psych:   Normal mood and affect.  ? ? ?Assessment: ?71 year old female with history of osteoporosis, HTN, HLD, type 2 diabetes, presenting today to discuss scheduling a colonoscopy.  She reports history of colon polyps with her last colonoscopy about 7 years ago at [facility].  She has been doing very well, but did have 2 episodes of bright red blood per rectum the first week of February which was new for her.  No recurrent symptoms.  Denies abdominal pain, rectal pain, constipation, diarrhea,  unintentional weight loss, known history of hemorrhoids, or family history of colon cancer.  Rectal bleeding may have been from a benign anorectal source such as hemorrhoids, but cannot rule out colon polyps or malignancy.  She will need a colonoscopy for further evaluation.  We will also check a CBC to evaluate for anemia. ? ?She is concerned about nausea and vomiting with a colon prep.  Advised that I can send in a prescription for Zofran to take while she is taking her colon prep. ? ? ?Plan: ?CBC ?Proceed with colonoscopy with propofol with Dr. Abbey Chatters in the near future.  The risks, benefits, and alternatives have been discussed with the patient in detail. The patient states understanding and desires to proceed. ?ASA 2 ?See separate instructions for diabetes medication adjustments. ?Prescription sent for Zofran 4 mg every 6-8 hours as needed on the day of colon prep. ?Follow-up after procedure. ? ? ?Aliene Altes, PA-C ?[facility] ?01/08/2022 ? ?

## 2022-01-07 NOTE — H&P (View-Only) (Signed)
? ?Referring Provider: Neale Burly, MD ?Primary Care Physician:  Neale Burly, MD ?Primary Gastroenterologist:  Dr. Abbey Chatters ? ?Chief Complaint  ?Patient presents with  ? Blood In Stools  ? ? ?HPI:   ?Vanessa Pierce is a 71 y.o. female presenting today at the request of Hasanaj, Samul Dada, MD for rectal bleeding.  ? ?Reviewed referral information.  Office visit with PCP on 12/16/2021.  Patient was doing well overall, but reported 2 episodes of bloody stools last week.  She was referred to GI. ? ?Today:  ?2 episode of bright red blood in stool and on tissue the first week of February. This was the first time this has ever happened. No recurrent symptoms.  No rectal pain or known hemorrhoids.  Last colonoscopy about 7 years ago in [place] she had a couple of polyps.  No family history of colon cancer. ? ?Denies constipation or diarrhea.  Bowels move daily to every other day.  Occasional mild lower abdominal discomfort just letting her know she needs to have a bowel movement.  No real bothersome abdominal pain.  ? ?Denies nausea, vomiting, reflux problems, or dysphagia.  ? ?All of her insulin is on a sliding scale.  ?Concerned about nausea and vomiting with colon prep.  ? ?Past Medical History:  ?Diagnosis Date  ? Arthritis   ? Diabetes mellitus without complication ([place])   ? Hyperlipidemia   ? Hypertension   ? Migraines   ? Osteoporosis   ? ? ?Past Surgical History:  ?Procedure Laterality Date  ? ABDOMINAL HYSTERECTOMY    ? COLONOSCOPY    ? KNEE ARTHROSCOPY Right   ? TOTAL KNEE ARTHROPLASTY Left 09/27/2020  ? Procedure: LEFT TOTAL KNEE ARTHROPLASTY-CEMENTED;  Surgeon: Marybelle Killings, MD;  Location: [facility];  Service: Orthopedics;  Laterality: Left;  ? TUBAL LIGATION    ? ? ?Current Outpatient Medications  ?Medication Sig Dispense Refill  ? alendronate (FOSAMAX) 70 MG tablet Take 70 mg by mouth once a week. Mondays    ? amLODipine (NORVASC) 10 MG tablet Take 10 mg by mouth daily.     ? aspirin  EC 325 MG tablet Take 1 tablet (325 mg total) by mouth daily. Must take at least 4 weeks postop for DVT prophylaxis. 30 tablet 0  ? gabapentin (NEURONTIN) 100 MG capsule Take 100 mg by mouth 3 (three) times daily.    ? glipiZIDE (GLUCOTROL) 5 MG tablet Take 5 mg by mouth 2 (two) times daily.    ? insulin NPH Human (NOVOLIN N) 100 UNIT/ML injection Inject 10 Units into the skin 2 (two) times daily after a meal.     ? insulin regular (NOVOLIN R) 100 units/mL injection Inject 4-8 Units into the skin in the morning and at bedtime. Sliding scale depending on sugar    ? metFORMIN (GLUCOPHAGE) 1000 MG tablet Take 1,000 mg by mouth 2 (two) times daily.    ? ondansetron (ZOFRAN) 4 MG tablet Take 1 tablet (4 mg total) by mouth every 6-8 hours as needed for nausea or vomiting. 5 tablet 0  ? spironolactone (ALDACTONE) 50 MG tablet Take 50 mg by mouth daily.    ? traZODone (DESYREL) 50 MG tablet Take 50 mg by mouth at bedtime as needed for sleep.     ? rosuvastatin (CRESTOR) 20 MG tablet Take 1 tablet (20 mg total) by mouth daily. 90 tablet 3  ? ?No current facility-administered medications for this visit.  ? ? ?Allergies as of 01/08/2022  ? (  No Known Allergies)  ? ? ?Family History  ?Problem Relation Age of Onset  ? Pancreatic cancer Mother   ? Heart attack Brother   ? Colon cancer Neg Hx   ? ? ?Social History  ? ?Socioeconomic History  ? Marital status: Single  ?  Spouse name: Not on file  ? Number of children: Not on file  ? Years of education: Not on file  ? Highest education level: Not on file  ?Occupational History  ? Occupation: retired   ?Tobacco Use  ? Smoking status: Never  ? Smokeless tobacco: Never  ?Vaping Use  ? Vaping Use: Never used  ?Substance and Sexual Activity  ? Alcohol use: Yes  ?  Comment: occasionally  ? Drug use: Never  ? Sexual activity: Not Currently  ?Other Topics Concern  ? Not on file  ?Social History Narrative  ? Not on file  ? ?Social Determinants of Health  ? ?Financial Resource Strain: Not on  file  ?Food Insecurity: Not on file  ?Transportation Needs: Not on file  ?Physical Activity: Not on file  ?Stress: Not on file  ?Social Connections: Not on file  ?Intimate Partner Violence: Not on file  ? ? ?Review of Systems: ?Gen: Denies any fever, chills, cold or flulike symptoms, presyncope, syncope. ?CV: Denies chest pain, heart palpitations. ?Resp: Denies shortness of breath or cough. ?GI: See HPI ?GU : Denies urinary burning, urinary frequency, urinary hesitancy ?MS: Denies joint pain. ?Derm: Denies rash. ?Psych: Denies depression, anxiety. ?Heme: See HPI. ? ?Physical Exam: ?BP 124/68   Pulse 80   Temp (!) 97.4 ?F (36.3 ?C) (Temporal)   Ht 5\' 4"  (1.626 m)   Wt 181 lb 12.8 oz (82.5 kg)   BMI 31.21 kg/m?  ?General:   Alert and oriented. Pleasant and cooperative. Well-nourished and well-developed.  ?Head:  Normocephalic and atraumatic. ?Eyes:  Without icterus, sclera clear and conjunctiva pink.  ?Ears:  Normal auditory acuity. ?Lungs:  Clear to auscultation bilaterally. No wheezes, rales, or rhonchi. No distress.  ?Heart:  S1, S2 present without murmurs appreciated.  ?Abdomen:  +BS, soft, non-tender and non-distended. No HSM noted. No guarding or rebound. No masses appreciated.  ?Rectal:  Deferred  ?Msk:  Symmetrical without gross deformities. Normal posture. ?Extremities:  Without edema. ?Neurologic:  Alert and  oriented x4;  grossly normal neurologically. ?Skin:  Intact without significant lesions or rashes. ?Psych:   Normal mood and affect.  ? ? ?Assessment: ?71 year old female with history of osteoporosis, HTN, HLD, type 2 diabetes, presenting today to discuss scheduling a colonoscopy.  She reports history of colon polyps with her last colonoscopy about 7 years ago at [facility].  She has been doing very well, but did have 2 episodes of bright red blood per rectum the first week of February which was new for her.  No recurrent symptoms.  Denies abdominal pain, rectal pain, constipation, diarrhea,  unintentional weight loss, known history of hemorrhoids, or family history of colon cancer.  Rectal bleeding may have been from a benign anorectal source such as hemorrhoids, but cannot rule out colon polyps or malignancy.  She will need a colonoscopy for further evaluation.  We will also check a CBC to evaluate for anemia. ? ?She is concerned about nausea and vomiting with a colon prep.  Advised that I can send in a prescription for Zofran to take while she is taking her colon prep. ? ? ?Plan: ?CBC ?Proceed with colonoscopy with propofol with Dr. Abbey Chatters in the near future.  The risks, benefits, and alternatives have been discussed with the patient in detail. The patient states understanding and desires to proceed. ?ASA 2 ?See separate instructions for diabetes medication adjustments. ?Prescription sent for Zofran 4 mg every 6-8 hours as needed on the day of colon prep. ?Follow-up after procedure. ? ? ?Aliene Altes, PA-C ?[facility] ?01/08/2022 ? ?

## 2022-01-08 ENCOUNTER — Other Ambulatory Visit: Payer: Self-pay

## 2022-01-08 ENCOUNTER — Other Ambulatory Visit: Payer: Self-pay | Admitting: *Deleted

## 2022-01-08 ENCOUNTER — Encounter: Payer: Self-pay | Admitting: *Deleted

## 2022-01-08 ENCOUNTER — Telehealth: Payer: Self-pay | Admitting: *Deleted

## 2022-01-08 ENCOUNTER — Ambulatory Visit: Payer: Medicare Other | Admitting: Gastroenterology

## 2022-01-08 ENCOUNTER — Encounter: Payer: Self-pay | Admitting: Gastroenterology

## 2022-01-08 VITALS — BP 124/68 | HR 80 | Temp 97.4°F | Ht 64.0 in | Wt 181.8 lb

## 2022-01-08 DIAGNOSIS — R112 Nausea with vomiting, unspecified: Secondary | ICD-10-CM

## 2022-01-08 DIAGNOSIS — Z860100 Personal history of colon polyps, unspecified: Secondary | ICD-10-CM | POA: Insufficient documentation

## 2022-01-08 DIAGNOSIS — K625 Hemorrhage of anus and rectum: Secondary | ICD-10-CM

## 2022-01-08 LAB — BASIC METABOLIC PANEL
BUN/Creatinine Ratio: 15 (calc) (ref 6–22)
BUN: 17 mg/dL (ref 7–25)
CO2: 27 mmol/L (ref 20–32)
Calcium: 10.2 mg/dL (ref 8.6–10.4)
Chloride: 102 mmol/L (ref 98–110)
Creat: 1.13 mg/dL — ABNORMAL HIGH (ref 0.60–1.00)
Glucose, Bld: 167 mg/dL — ABNORMAL HIGH (ref 65–99)
Potassium: 4.3 mmol/L (ref 3.5–5.3)
Sodium: 141 mmol/L (ref 135–146)

## 2022-01-08 LAB — CBC WITH DIFFERENTIAL/PLATELET
Absolute Monocytes: 481 cells/uL (ref 200–950)
Basophils Absolute: 29 cells/uL (ref 0–200)
Basophils Relative: 0.5 %
Eosinophils Absolute: 197 cells/uL (ref 15–500)
Eosinophils Relative: 3.4 %
HCT: 43.1 % (ref 35.0–45.0)
Hemoglobin: 14.2 g/dL (ref 11.7–15.5)
Lymphs Abs: 1810 cells/uL (ref 850–3900)
MCH: 27.8 pg (ref 27.0–33.0)
MCHC: 32.9 g/dL (ref 32.0–36.0)
MCV: 84.5 fL (ref 80.0–100.0)
MPV: 9.3 fL (ref 7.5–12.5)
Monocytes Relative: 8.3 %
Neutro Abs: 3283 cells/uL (ref 1500–7800)
Neutrophils Relative %: 56.6 %
Platelets: 294 10*3/uL (ref 140–400)
RBC: 5.1 10*6/uL (ref 3.80–5.10)
RDW: 13.1 % (ref 11.0–15.0)
Total Lymphocyte: 31.2 %
WBC: 5.8 10*3/uL (ref 3.8–10.8)

## 2022-01-08 MED ORDER — ONDANSETRON HCL 4 MG PO TABS: ORAL_TABLET | ORAL | 0 refills | Status: AC

## 2022-01-08 NOTE — Patient Instructions (Addendum)
Please have blood work completed at [facility] ? ?We will arrange for you to have a colonoscopy in the near future with Dr. Abbey Chatters. ?1 day prior to your colonoscopy: Take one half dose of glipizide (5 mg in the morning), one half dose of metformin (500 mg in the morning and evening).  Take your insulin as needed. ?Day of your colonoscopy: Do not take any morning diabetes medications. ? ?Keep a close check on your blood sugars while you are on a clear liquid diet and correct any low blood sugars with approved sugary clear liquids. ? ?To help prevent nausea with your colon prep, I have sent a prescription of Zofran to your pharmacy.  You can take this every 6-8 hours as needed.  I recommend you start taking this medication the morning of your colon prep to prevent any nausea.  Please let me know if you have any problems with your prep. ? ?We will follow-up with you after your colonoscopy. ? ?Aliene Altes, PA-C ?[facility] ?01/08/2022 ? ?

## 2022-01-08 NOTE — Telephone Encounter (Signed)
PA approved via [facility]. Auth# [id number], DOS: Jan 16, 2022 - Apr 16, 2022 ?

## 2022-01-09 NOTE — Patient Instructions (Signed)
? ? ? ? ? ? Vanessa Pierce ? 01/09/2022  ?  ? @PREFPERIOPPHARMACY @ ? ? Your procedure is scheduled on  01/16/2022. ? ? Report to [facility] at  [phone]  A.M. ? ? Call this number if you have problems the morning of surgery: ? [phone] ? ? Remember: ? Follow the diet and prep instructions given to you by the office. ? ?  Take 1/2 of your night time insulin the night before your procedure. ? ?  DO NOT take any medication for diabetes the morning of your procedure. ?  ? Take these medicines the morning of surgery with A SIP OF WATER  ? ?           amlodipine, gabapentin, zofran (if needed). ?  ? ? Do not wear jewelry, make-up or nail polish. ? Do not wear lotions, powders, or perfumes, or deodorant. ? Do not shave 48 hours prior to surgery.  Men may shave face and neck. ? Do not bring valuables to the hospital. ? [facility] is not responsible for any belongings or valuables. ? ?Contacts, dentures or bridgework may not be worn into surgery.  Leave your suitcase in the car.  After surgery it may be brought to your room. ? ?For patients admitted to the hospital, discharge time will be determined by your treatment team. ? ?Patients discharged the day of surgery will not be allowed to drive home and must have someone with them for 24 hours.  ? ? ?Special instructions:   DO NOT smoke tobacco or vape for 24 hours before your procedure. ? ?Please read over the following fact sheets that you were given. ?Anesthesia Post-op Instructions and Care and Recovery After Surgery ?  ? ? ? Colonoscopy, Adult, Care After ?This sheet gives you information about how to care for yourself after your procedure. Your health care provider may also give you more specific instructions. If you have problems or questions, contact your health care provider. ?What can I expect after the procedure? ?After the procedure, it is common to have: ?A small amount of blood in your stool for 24 hours after the procedure. ?Some gas. ?Mild cramping or  bloating of your abdomen. ?Follow these instructions at home: ?Eating and drinking ? ?Drink enough fluid to keep your urine pale yellow. ?Follow instructions from your health care provider about eating or drinking restrictions. ?Resume your normal diet as instructed by your health care provider. Avoid heavy or fried foods that are hard to digest. ?Activity ?Rest as told by your health care provider. ?Avoid sitting for a long time without moving. Get up to take short walks every 1-2 hours. This is important to improve blood flow and breathing. Ask for help if you feel weak or unsteady. ?Return to your normal activities as told by your health care provider. Ask your health care provider what activities are safe for you. ?Managing cramping and bloating ? ?Try walking around when you have cramps or feel bloated. ?Apply heat to your abdomen as told by your health care provider. Use the heat source that your health care provider recommends, such as a moist heat pack or a heating pad. ?Place a towel between your skin and the heat source. ?Leave the heat on for 20-30 minutes. ?Remove the heat if your skin turns bright red. This is especially important if you are unable to feel pain, heat, or cold. You may have a greater risk of getting burned. ?General instructions ?If you were given a sedative  during the procedure, it can affect you for several hours. Do not drive or operate machinery until your health care provider says that it is safe. ?For the first 24 hours after the procedure: ?Do not sign important documents. ?Do not drink alcohol. ?Do your regular daily activities at a slower pace than normal. ?Eat soft foods that are easy to digest. ?Take over-the-counter and prescription medicines only as told by your health care provider. ?Keep all follow-up visits as told by your health care provider. This is important. ?Contact a health care provider if: ?You have blood in your stool 2-3 days after the procedure. ?Get help  right away if you have: ?More than a small spotting of blood in your stool. ?Large blood clots in your stool. ?Swelling of your abdomen. ?Nausea or vomiting. ?A fever. ?Increasing pain in your abdomen that is not relieved with medicine. ?Summary ?After the procedure, it is common to have a small amount of blood in your stool. You may also have mild cramping and bloating of your abdomen. ?If you were given a sedative during the procedure, it can affect you for several hours. Do not drive or operate machinery until your health care provider says that it is safe. ?Get help right away if you have a lot of blood in your stool, nausea or vomiting, a fever, or increased pain in your abdomen. ?This information is not intended to replace advice given to you by your health care provider. Make sure you discuss any questions you have with your health care provider. ?Document Revised: 09/01/2019 Document Reviewed: 05/22/2019 ?Elsevier Patient Education ? [place]. ?Monitored Anesthesia Care, Care After ?This sheet gives you information about how to care for yourself after your procedure. Your health care provider may also give you more specific instructions. If you have problems or questions, contact your health care provider. ?What can I expect after the procedure? ?After the procedure, it is common to have: ?Tiredness. ?Forgetfulness about what happened after the procedure. ?Impaired judgment for important decisions. ?Nausea or vomiting. ?Some difficulty with balance. ?Follow these instructions at home: ?For the time period you were told by your health care provider: ?  ?Rest as needed. ?Do not participate in activities where you could fall or become injured. ?Do not drive or use machinery. ?Do not drink alcohol. ?Do not take sleeping pills or medicines that cause drowsiness. ?Do not make important decisions or sign legal documents. ?Do not take care of children on your own. ?Eating and drinking ?Follow the diet that  is recommended by your health care provider. ?Drink enough fluid to keep your urine pale yellow. ?If you vomit: ?Drink water, juice, or soup when you can drink without vomiting. ?Make sure you have little or no nausea before eating solid foods. ?General instructions ?Have a responsible adult stay with you for the time you are told. It is important to have someone help care for you until you are awake and alert. ?Take over-the-counter and prescription medicines only as told by your health care provider. ?If you have sleep apnea, surgery and certain medicines can increase your risk for breathing problems. Follow instructions from your health care provider about wearing your sleep device: ?Anytime you are sleeping, including during daytime naps. ?While taking prescription pain medicines, sleeping medicines, or medicines that make you drowsy. ?Avoid smoking. ?Keep all follow-up visits as told by your health care provider. This is important. ?Contact a health care provider if: ?You keep feeling nauseous or you keep vomiting. ?You feel  light-headed. ?You are still sleepy or having trouble with balance after 24 hours. ?You develop a rash. ?You have a fever. ?You have redness or swelling around the IV site. ?Get help right away if: ?You have trouble breathing. ?You have new-onset confusion at home. ?Summary ?For several hours after your procedure, you may feel tired. You may also be forgetful and have poor judgment. ?Have a responsible adult stay with you for the time you are told. It is important to have someone help care for you until you are awake and alert. ?Rest as told. Do not drive or operate machinery. Do not drink alcohol or take sleeping pills. ?Get help right away if you have trouble breathing, or if you suddenly become confused. ?This information is not intended to replace advice given to you by your health care provider. Make sure you discuss any questions you have with your health care provider. ?Document  Revised: 07/11/2020 Document Reviewed: 09/28/2019 ?Elsevier Patient Education ? [place]. ? ?

## 2022-01-13 ENCOUNTER — Encounter (HOSPITAL_COMMUNITY)
Admission: RE | Admit: 2022-01-13 | Discharge: 2022-01-13 | Disposition: A | Discharge status: Home / Self Care | Payer: Medicare Other | Source: Ambulatory Visit | Attending: Internal Medicine | Admitting: Internal Medicine

## 2022-01-13 ENCOUNTER — Other Ambulatory Visit: Payer: Self-pay

## 2022-01-13 ENCOUNTER — Encounter (HOSPITAL_COMMUNITY): Payer: Self-pay

## 2022-01-16 ENCOUNTER — Encounter (HOSPITAL_COMMUNITY): Payer: Self-pay

## 2022-01-16 ENCOUNTER — Encounter (HOSPITAL_COMMUNITY)
Admission: RE | Disposition: A | Discharge status: Home / Self Care | Payer: Self-pay | Source: Home / Self Care | Attending: Internal Medicine

## 2022-01-16 ENCOUNTER — Ambulatory Visit (HOSPITAL_COMMUNITY)
Admission: RE | Admit: 2022-01-16 | Discharge: 2022-01-16 | Disposition: A | Discharge status: Home / Self Care | Payer: Medicare Other | Attending: Internal Medicine | Admitting: Internal Medicine

## 2022-01-16 ENCOUNTER — Ambulatory Visit (HOSPITAL_BASED_OUTPATIENT_CLINIC_OR_DEPARTMENT_OTHER): Payer: Medicare Other | Admitting: Certified Registered Nurse Anesthetist

## 2022-01-16 ENCOUNTER — Ambulatory Visit (HOSPITAL_COMMUNITY): Payer: Medicare Other | Admitting: Certified Registered Nurse Anesthetist

## 2022-01-16 DIAGNOSIS — Z7984 Long term (current) use of oral hypoglycemic drugs: Secondary | ICD-10-CM | POA: Insufficient documentation

## 2022-01-16 DIAGNOSIS — E119 Type 2 diabetes mellitus without complications: Secondary | ICD-10-CM | POA: Diagnosis not present

## 2022-01-16 DIAGNOSIS — K921 Melena: Secondary | ICD-10-CM | POA: Diagnosis not present

## 2022-01-16 DIAGNOSIS — I10 Essential (primary) hypertension: Secondary | ICD-10-CM | POA: Diagnosis not present

## 2022-01-16 DIAGNOSIS — Z79899 Other long term (current) drug therapy: Secondary | ICD-10-CM | POA: Insufficient documentation

## 2022-01-16 DIAGNOSIS — E785 Hyperlipidemia, unspecified: Secondary | ICD-10-CM | POA: Insufficient documentation

## 2022-01-16 DIAGNOSIS — K5730 Diverticulosis of large intestine without perforation or abscess without bleeding: Secondary | ICD-10-CM | POA: Diagnosis not present

## 2022-01-16 DIAGNOSIS — K625 Hemorrhage of anus and rectum: Secondary | ICD-10-CM | POA: Diagnosis not present

## 2022-01-16 DIAGNOSIS — D124 Benign neoplasm of descending colon: Secondary | ICD-10-CM | POA: Insufficient documentation

## 2022-01-16 DIAGNOSIS — K648 Other hemorrhoids: Secondary | ICD-10-CM | POA: Insufficient documentation

## 2022-01-16 DIAGNOSIS — K635 Polyp of colon: Secondary | ICD-10-CM | POA: Diagnosis not present

## 2022-01-16 DIAGNOSIS — Z794 Long term (current) use of insulin: Secondary | ICD-10-CM | POA: Insufficient documentation

## 2022-01-16 DIAGNOSIS — Z86010 Personal history of colonic polyps: Secondary | ICD-10-CM | POA: Insufficient documentation

## 2022-01-16 HISTORY — PX: COLONOSCOPY WITH PROPOFOL: SHX5780

## 2022-01-16 HISTORY — PX: POLYPECTOMY: SHX149

## 2022-01-16 LAB — GLUCOSE, CAPILLARY: Glucose-Capillary: 135 mg/dL — ABNORMAL HIGH (ref 70–99)

## 2022-01-16 SURGERY — COLONOSCOPY WITH PROPOFOL
Anesthesia: General

## 2022-01-16 MED ORDER — PROPOFOL 10 MG/ML IV BOLUS
INTRAVENOUS | Status: DC | PRN
  Administered 2022-01-16: 100 mg via INTRAVENOUS

## 2022-01-16 MED ORDER — LIDOCAINE HCL (CARDIAC) PF 100 MG/5ML IV SOSY
PREFILLED_SYRINGE | INTRAVENOUS | Status: DC | PRN
  Administered 2022-01-16: 50 mg via INTRAVENOUS

## 2022-01-16 MED ORDER — PROPOFOL 500 MG/50ML IV EMUL
INTRAVENOUS | Status: DC | PRN
  Administered 2022-01-16: 125 ug/kg/min via INTRAVENOUS

## 2022-01-16 MED ORDER — LACTATED RINGERS IV SOLN: INTRAVENOUS | Status: DC

## 2022-01-16 NOTE — Op Note (Signed)
[facility] ?Patient Name: Vanessa Pierce ?Procedure Date: 01/16/2022 8:03 AM ?MRN: [id number] ?Date of Birth: 1951/06/01 ?Attending MD: Elon Alas. Abbey Chatters , DO ?CSN: [id number] ?Age: 71 ?Admit Type: Outpatient ?Procedure:                Colonoscopy ?Indications:              Rectal bleeding ?Providers:                Elon Alas. Abbey Chatters, DO, [place] Page, Raphael Gibney,  ?                          Technician ?Referring MD:              ?Medicines:                See the Anesthesia note for documentation of the  ?                          administered medications ?Complications:            No immediate complications. ?Estimated Blood Loss:     Estimated blood loss: none. Estimated blood loss  ?                          was minimal. ?Procedure:                Pre-Anesthesia Assessment: ?                          - The anesthesia plan was to use monitored  ?                          anesthesia care (MAC). ?                          After obtaining informed consent, the colonoscope  ?                          was passed under direct vision. Throughout the  ?                          procedure, the patient's blood pressure, pulse, and  ?                          oxygen saturations were monitored continuously. The  ?                          PCF-HQ190L ([id number]) scope was introduced through  ?                          the anus and advanced to the the cecum, identified  ?                          by appendiceal orifice and ileocecal valve. The  ?                          colonoscopy was performed without difficulty. The  ?  patient tolerated the procedure well. The quality  ?                          of the bowel preparation was evaluated using the  ?                          BBPS [facility] Bowel Preparation Scale) with scores  ?                          of: Right Colon = 3, Transverse Colon = 3 and Left  ?                          Colon = 3 (entire mucosa seen well with no residual  ?                           staining, small fragments of stool or opaque  ?                          liquid). The total BBPS score equals 9. ?Scope In: 8:11:30 AM ?Scope Out: 8:23:05 AM ?Scope Withdrawal Time: 0 hours 8 minutes 27 seconds  ?Total Procedure Duration: 0 hours 11 minutes 35 seconds  ?Findings: ?     The perianal and digital rectal examinations were normal. ?     Non-bleeding internal hemorrhoids were found during retroflexion. ?     Multiple medium-mouthed diverticula were found in the sigmoid colon. ?     A 5 mm polyp was found in the descending colon. The polyp was sessile.  ?     The polyp was removed with a cold snare. Resection and retrieval were  ?     complete. ?     The exam was otherwise without abnormality. ?Impression:               - Non-bleeding internal hemorrhoids. ?                          - Diverticulosis in the sigmoid colon. ?                          - One 5 mm polyp in the descending colon, removed  ?                          with a cold snare. Resected and retrieved. ?                          - The examination was otherwise normal. ?Moderate Sedation: ?     Per Anesthesia Care ?Recommendation:           - Patient has a contact number available for  ?                          emergencies. The signs and symptoms of potential  ?                          delayed complications were discussed with the  ?  patient. Return to normal activities tomorrow.  ?                          Written discharge instructions were provided to the  ?                          patient. ?                          - Resume previous diet. ?                          - Continue present medications. ?                          - Await pathology results. ?                          - Repeat colonoscopy in 5 years for surveillance. ?                          - Return to GI clinic for hemorrhoid banding if  ?                          bleeding continues, otherwise follow up PRN. ?Procedure Code(s):        ---  Professional --- ?                          [phone], Colonoscopy, flexible; with removal of  ?                          tumor(s), polyp(s), or other lesion(s) by snare  ?                          technique ?Diagnosis Code(s):        --- Professional --- ?                          U04.5, Other hemorrhoids ?                          K63.5, Polyp of colon ?                          K62.5, Hemorrhage of anus and rectum ?                          K57.30, Diverticulosis of large intestine without  ?                          perforation or abscess without bleeding ?CPT copyright 2019 American Medical Association. All rights reserved. ?The codes documented in this report are preliminary and upon coder review may  ?be revised to meet current compliance requirements. ?Elon Alas. Abbey Chatters, DO ?Elon Alas. Lamy, DO ?01/16/2022 8:25:12 AM ?This report has been signed electronically. ?Number of Addenda: 0 ?

## 2022-01-16 NOTE — Interval H&P Note (Signed)
History and Physical Interval Note: ? ?01/16/2022 ?7:59 AM ? ?Vanessa Pierce  has presented today for surgery, with the diagnosis of RB, hx colon polyps.  The various methods of treatment have been discussed with the patient and family. After consideration of risks, benefits and other options for treatment, the patient has consented to  Procedure(s) with comments: ?COLONOSCOPY WITH PROPOFOL (N/A) - 8:00am, asa 2 as a surgical intervention.  The patient's history has been reviewed, patient examined, no change in status, stable for surgery.  I have reviewed the patient's chart and labs.  Questions were answered to the patient's satisfaction.   ? ? ?[name] ? ? ?

## 2022-01-16 NOTE — Anesthesia Preprocedure Evaluation (Signed)
Anesthesia Evaluation  ?Patient identified by MRN, date of birth, ID band ?Patient awake ? ? ? ?Reviewed: ?Allergy & Precautions, H&P , NPO status , Patient's Chart, lab work & pertinent test results, reviewed documented beta blocker date and time  ? ?Airway ?Mallampati: II ? ?TM Distance: >3 FB ?Neck ROM: full ? ? ? Dental ?no notable dental hx. ? ?  ?Pulmonary ?neg pulmonary ROS,  ?  ?Pulmonary exam normal ?breath sounds clear to auscultation ? ? ? ? ? ? Cardiovascular ?Exercise Tolerance: Good ?hypertension, negative cardio ROS ? ? ?Rhythm:regular Rate:Normal ? ? ?  ?Neuro/Psych ? Headaches, negative psych ROS  ? GI/Hepatic ?negative GI ROS, Neg liver ROS,   ?Endo/Other  ?negative endocrine ROSdiabetes, Type 2 ? Renal/GU ?negative Renal ROS  ?negative genitourinary ?  ?Musculoskeletal ? ? Abdominal ?  ?Peds ? Hematology ?negative hematology ROS ?(+)   ?Anesthesia Other Findings ? ? Reproductive/Obstetrics ?negative OB ROS ? ?  ? ? ? ? ? ? ? ? ? ? ? ? ? ?  ?  ? ? ? ? ? ? ? ? ?Anesthesia Physical ?Anesthesia Plan ? ?ASA: 2 ? ?Anesthesia Plan: General  ? ?Post-op Pain Management:   ? ?Induction:  ? ?PONV Risk Score and Plan: Propofol infusion ? ?Airway Management Planned:  ? ?Additional Equipment:  ? ?Intra-op Plan:  ? ?Post-operative Plan:  ? ?Informed Consent: I have reviewed the patients History and Physical, chart, labs and discussed the procedure including the risks, benefits and alternatives for the proposed anesthesia with the patient or authorized representative who has indicated his/her understanding and acceptance.  ? ? ? ?Dental Advisory Given ? ?Plan Discussed with: CRNA ? ?Anesthesia Plan Comments:   ? ? ? ? ? ? ?Anesthesia Quick Evaluation ? ?

## 2022-01-16 NOTE — Anesthesia Postprocedure Evaluation (Signed)
Anesthesia Post Note ? ?Patient: Vanessa Pierce ? ?Procedure(s) Performed: COLONOSCOPY WITH PROPOFOL ?POLYPECTOMY INTESTINAL ? ?Patient location during evaluation: Phase II ?Anesthesia Type: General ?Level of consciousness: awake ?Pain management: pain level controlled ?Vital Signs Assessment: post-procedure vital signs reviewed and stable ?Respiratory status: spontaneous breathing and respiratory function stable ?Cardiovascular status: blood pressure returned to baseline and stable ?Postop Assessment: no headache and no apparent nausea or vomiting ?Anesthetic complications: no ?Comments: Late entry ? ? ?No notable events documented. ? ? ?Last Vitals:  ?Vitals:  ? 01/16/22 0700 01/16/22 0825  ?BP: (!) 142/84 129/77  ?Pulse: 70 68  ?Resp: 16 18  ?Temp:  36.7 ?C  ?SpO2: 99% 98%  ?  ?Last Pain:  ?Vitals:  ? 01/16/22 0825  ?TempSrc: Oral  ?PainSc: 0-No pain  ? ? ?  ?  ?  ?  ?  ?  ? ?Louann Sjogren ? ? ? ? ?

## 2022-01-16 NOTE — Discharge Instructions (Addendum)
?  Colonoscopy ?Discharge Instructions ? ?Read the instructions outlined below and refer to this sheet in the next few weeks. These discharge instructions provide you with general information on caring for yourself after you leave the hospital. Your doctor may also give you specific instructions. While your treatment has been planned according to the most current medical practices available, unavoidable complications occasionally occur.  ? ?ACTIVITY ?You may resume your regular activity, but move at a slower pace for the next 24 hours.  ?Take frequent rest periods for the next 24 hours.  ?Walking will help get rid of the air and reduce the bloated feeling in your belly (abdomen).  ?No driving for 24 hours (because of the medicine (anesthesia) used during the test).   ?Do not sign any important legal documents or operate any machinery for 24 hours (because of the anesthesia used during the test).  ?NUTRITION ?Drink plenty of fluids.  ?You may resume your normal diet as instructed by your doctor.  ?Begin with a light meal and progress to your normal diet. Heavy or fried foods are harder to digest and may make you feel sick to your stomach (nauseated).  ?Avoid alcoholic beverages for 24 hours or as instructed.  ?MEDICATIONS ?You may resume your normal medications unless your doctor tells you otherwise.  ?WHAT YOU CAN EXPECT TODAY ?Some feelings of bloating in the abdomen.  ?Passage of more gas than usual.  ?Spotting of blood in your stool or on the toilet paper.  ?IF YOU HAD POLYPS REMOVED DURING THE COLONOSCOPY: ?No aspirin products for 7 days or as instructed.  ?No alcohol for 7 days or as instructed.  ?Eat a soft diet for the next 24 hours.  ?FINDING OUT THE RESULTS OF YOUR TEST ?Not all test results are available during your visit. If your test results are not back during the visit, make an appointment with your caregiver to find out the results. Do not assume everything is normal if you have not heard from your  caregiver or the medical facility. It is important for you to follow up on all of your test results.  ?SEEK IMMEDIATE MEDICAL ATTENTION IF: ?You have more than a spotting of blood in your stool.  ?Your belly is swollen (abdominal distention).  ?You are nauseated or vomiting.  ?You have a temperature over 101.  ?You have abdominal pain or discomfort that is severe or gets worse throughout the day.  ? ?Your colonoscopy revealed 1 polyp(s) which I removed successfully. Await pathology results, my office will contact you. I recommend repeating colonoscopy in 5 years for surveillance purposes.  ? ? ?You also have diverticulosis and internal hemorrhoids. I would recommend increasing fiber in your diet or adding OTC Benefiber/Metamucil. Be sure to drink at least 4 to 6 glasses of water daily. If bleeding comes back then call our office to make an appt with Roseanne Kaufman to discuss hemorrhoid banding. Otherwise follow up as needed.  ? ? ?I hope you have a great rest of your week! ? ?Elon Alas. Abbey Chatters, D.O. ?Gastroenterology and Hepatology ?[facility] ? ?

## 2022-01-16 NOTE — Transfer of Care (Signed)
Immediate Anesthesia Transfer of Care Note ? ?Patient: Vanessa Pierce ? ?Procedure(s) Performed: COLONOSCOPY WITH PROPOFOL ?POLYPECTOMY INTESTINAL ? ?Patient Location: Short Stay ? ?Anesthesia Type:General ? ?Level of Consciousness: awake, alert  and oriented ? ?Airway & Oxygen Therapy: Patient Spontanous Breathing ? ?Post-op Assessment: Report given to RN and Post -op Vital signs reviewed and stable ? ?Post vital signs: Reviewed and stable ? ?Last Vitals:  ?Vitals Value Taken Time  ?BP    ?Temp    ?Pulse    ?Resp    ?SpO2    ? ? ?Last Pain:  ?Vitals:  ? 01/16/22 0807  ?PainSc: 0-No pain  ?   ? ?Patients Stated Pain Goal: 2 (01/16/22 4081) ? ?Complications: No notable events documented. ?

## 2022-01-18 ENCOUNTER — Telehealth: Payer: Self-pay | Admitting: Gastroenterology

## 2022-01-18 ENCOUNTER — Encounter: Payer: Self-pay | Admitting: Gastroenterology

## 2022-01-18 ENCOUNTER — Telehealth: Payer: Self-pay | Admitting: *Deleted

## 2022-01-18 NOTE — Telephone Encounter (Signed)
Received and reviewed colonoscopy report from [facility] dated 12/13/2014. ? ?Findings: Normal colonoscopy. ? ?Recommended repeat in 10 years. ? ? ? ?No additional recommendations at this time.  We will have report scanned into patient's chart. ?

## 2022-01-18 NOTE — Telephone Encounter (Signed)
Ortho bundle 1 year call completed. ?

## 2022-01-19 LAB — SURGICAL PATHOLOGY

## 2022-01-21 ENCOUNTER — Encounter (HOSPITAL_COMMUNITY): Payer: Self-pay | Admitting: Internal Medicine

## 2022-01-22 IMAGING — CR DG KNEE 1-2V*L*
2 series · 2 of 2 positions shown · non-contrast
Comparison: None.

CLINICAL DATA: Status post total knee replacement.

EXAM:
LEFT KNEE - 1-2 VIEW

[AP]
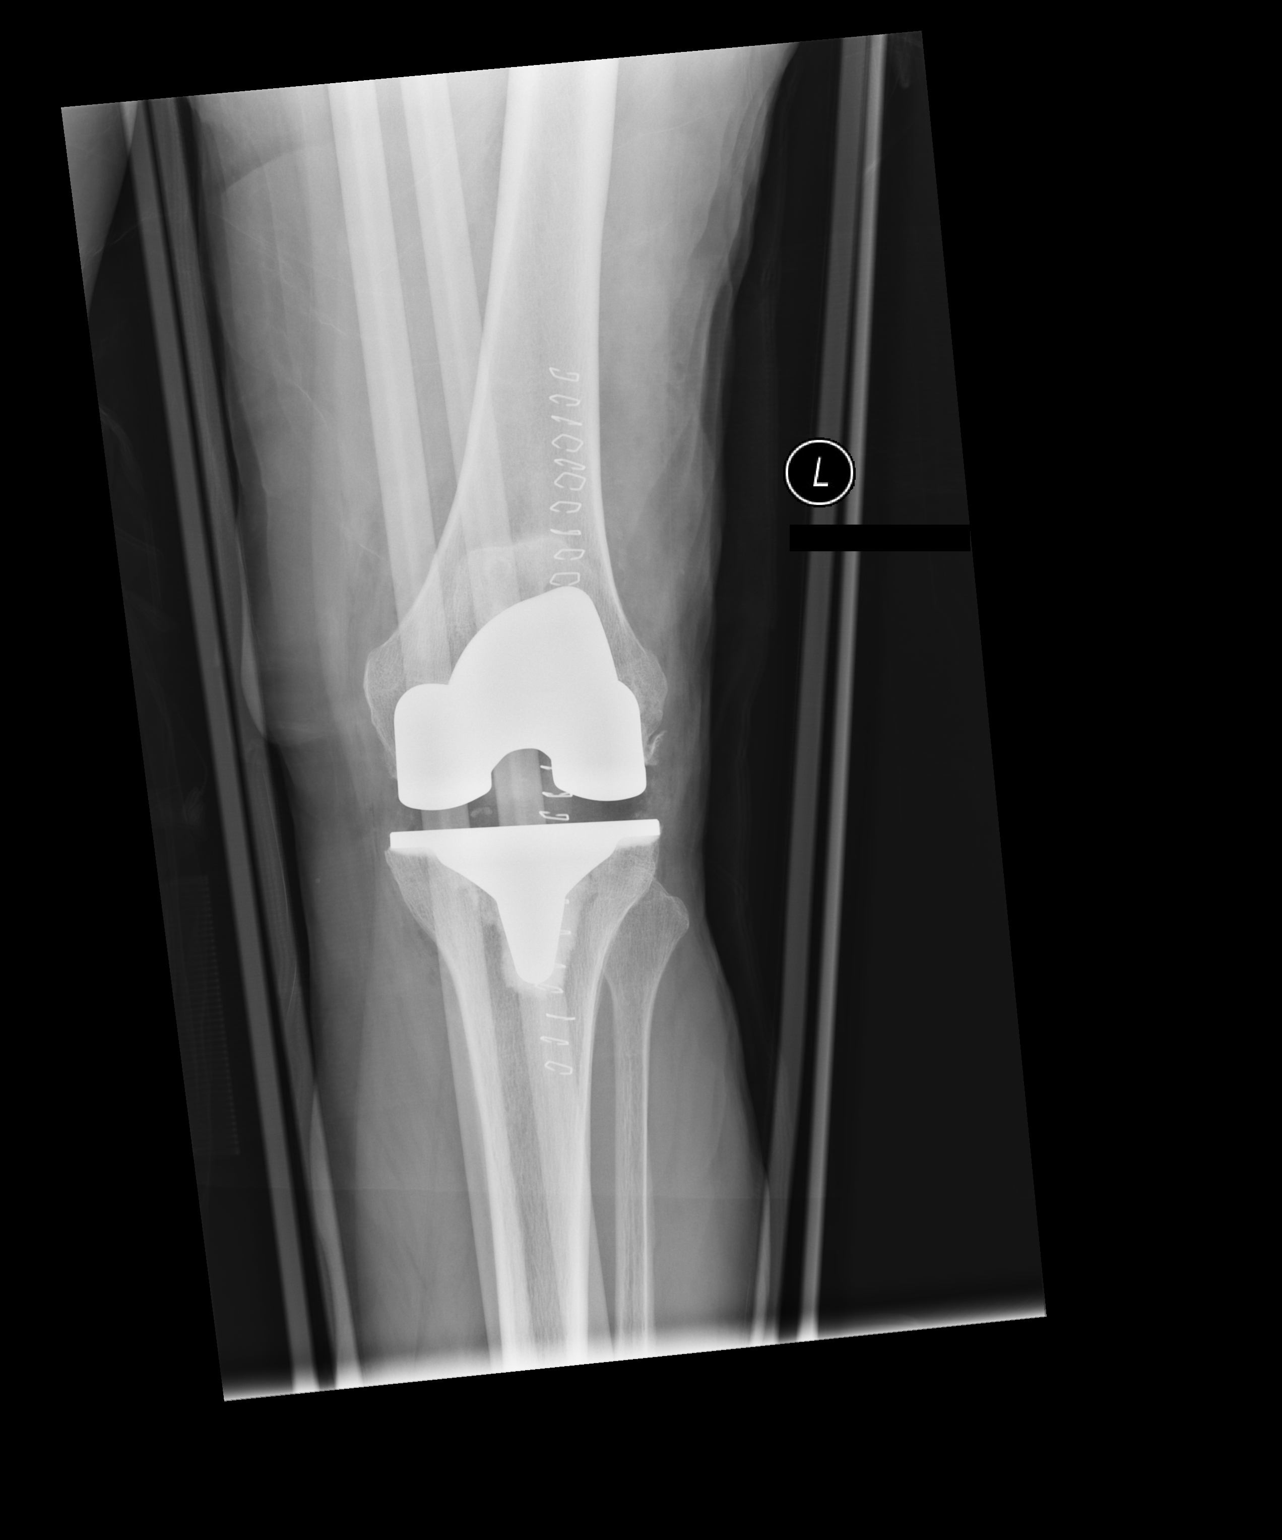

[xtable lateral]
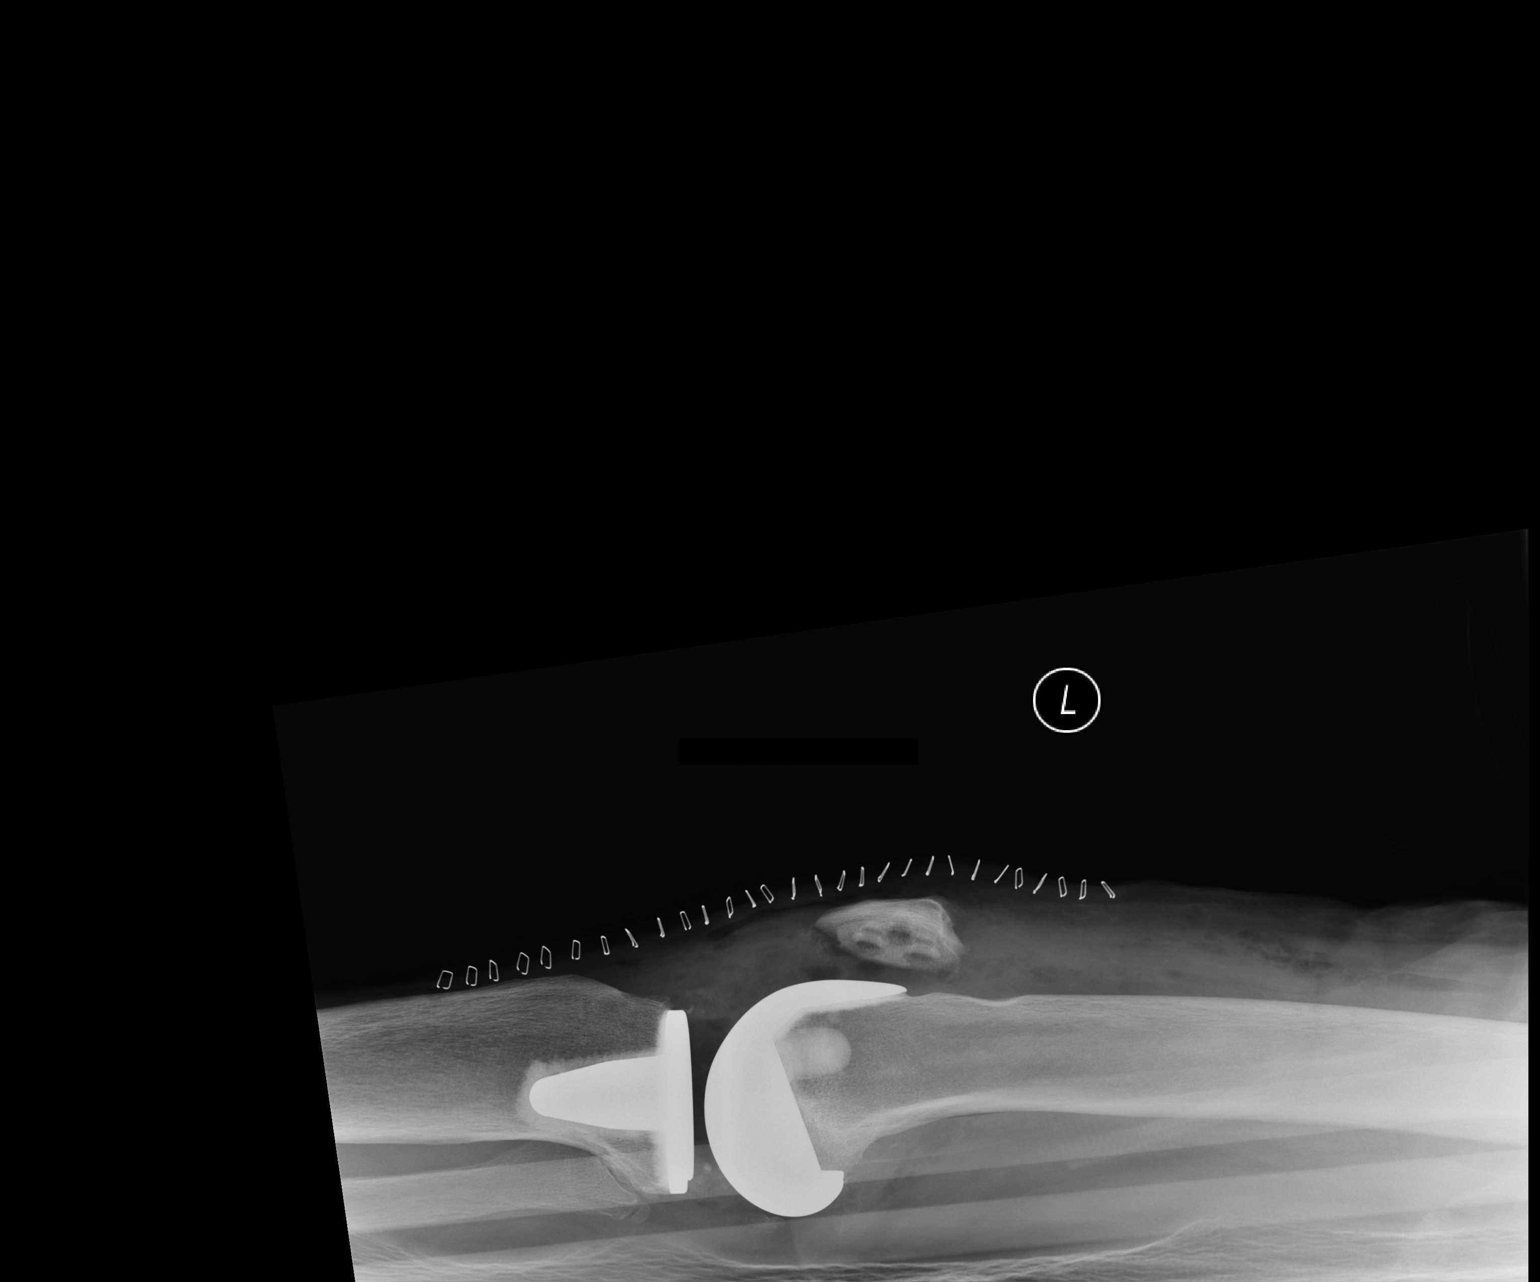

[2 of 2 positions shown; findings below may reference images not displayed]

FINDINGS: Sequelae of total knee arthroplasty are identified. The prosthetic
components appear normally aligned, and no acute fracture is
identified. There is a small well corticated ossicle adjacent to the
lateral femoral condyle. Postoperative gas is noted in the knee
joint and surrounding soft tissues, and skin staples are in place
anteriorly.
IMPRESSION: Left total knee arthroplasty without evidence of acute osseous
abnormality.

## 2022-01-26 DIAGNOSIS — H524 Presbyopia: Secondary | ICD-10-CM | POA: Diagnosis not present

## 2022-03-18 DIAGNOSIS — K219 Gastro-esophageal reflux disease without esophagitis: Secondary | ICD-10-CM | POA: Diagnosis not present

## 2022-03-18 DIAGNOSIS — M810 Age-related osteoporosis without current pathological fracture: Secondary | ICD-10-CM | POA: Diagnosis not present

## 2022-03-18 DIAGNOSIS — E1142 Type 2 diabetes mellitus with diabetic polyneuropathy: Secondary | ICD-10-CM | POA: Diagnosis not present

## 2022-03-18 DIAGNOSIS — I10 Essential (primary) hypertension: Secondary | ICD-10-CM | POA: Diagnosis not present

## 2022-03-18 DIAGNOSIS — E1165 Type 2 diabetes mellitus with hyperglycemia: Secondary | ICD-10-CM | POA: Diagnosis not present

## 2022-03-18 DIAGNOSIS — E7849 Other hyperlipidemia: Secondary | ICD-10-CM | POA: Diagnosis not present

## 2022-03-18 DIAGNOSIS — M179 Osteoarthritis of knee, unspecified: Secondary | ICD-10-CM | POA: Diagnosis not present

## 2022-03-18 DIAGNOSIS — R002 Palpitations: Secondary | ICD-10-CM | POA: Diagnosis not present

## 2022-03-18 DIAGNOSIS — L638 Other alopecia areata: Secondary | ICD-10-CM | POA: Diagnosis not present

## 2022-03-18 DIAGNOSIS — Z0000 Encounter for general adult medical examination without abnormal findings: Secondary | ICD-10-CM | POA: Diagnosis not present

## 2022-03-24 DIAGNOSIS — M8589 Other specified disorders of bone density and structure, multiple sites: Secondary | ICD-10-CM | POA: Diagnosis not present

## 2022-03-24 DIAGNOSIS — M810 Age-related osteoporosis without current pathological fracture: Secondary | ICD-10-CM | POA: Diagnosis not present

## 2022-05-06 DIAGNOSIS — H00033 Abscess of eyelid right eye, unspecified eyelid: Secondary | ICD-10-CM | POA: Diagnosis not present

## 2022-06-18 DIAGNOSIS — M179 Osteoarthritis of knee, unspecified: Secondary | ICD-10-CM | POA: Diagnosis not present

## 2022-06-18 DIAGNOSIS — E7849 Other hyperlipidemia: Secondary | ICD-10-CM | POA: Diagnosis not present

## 2022-06-18 DIAGNOSIS — K219 Gastro-esophageal reflux disease without esophagitis: Secondary | ICD-10-CM | POA: Diagnosis not present

## 2022-06-18 DIAGNOSIS — M810 Age-related osteoporosis without current pathological fracture: Secondary | ICD-10-CM | POA: Diagnosis not present

## 2022-06-18 DIAGNOSIS — E1142 Type 2 diabetes mellitus with diabetic polyneuropathy: Secondary | ICD-10-CM | POA: Diagnosis not present

## 2022-06-18 DIAGNOSIS — I10 Essential (primary) hypertension: Secondary | ICD-10-CM | POA: Diagnosis not present

## 2022-09-24 DIAGNOSIS — I10 Essential (primary) hypertension: Secondary | ICD-10-CM | POA: Diagnosis not present

## 2022-09-24 DIAGNOSIS — K219 Gastro-esophageal reflux disease without esophagitis: Secondary | ICD-10-CM | POA: Diagnosis not present

## 2022-09-24 DIAGNOSIS — E1142 Type 2 diabetes mellitus with diabetic polyneuropathy: Secondary | ICD-10-CM | POA: Diagnosis not present

## 2022-09-24 DIAGNOSIS — E7849 Other hyperlipidemia: Secondary | ICD-10-CM | POA: Diagnosis not present

## 2022-09-24 DIAGNOSIS — M179 Osteoarthritis of knee, unspecified: Secondary | ICD-10-CM | POA: Diagnosis not present

## 2022-09-24 DIAGNOSIS — M810 Age-related osteoporosis without current pathological fracture: Secondary | ICD-10-CM | POA: Diagnosis not present

## 2022-12-28 DIAGNOSIS — E1142 Type 2 diabetes mellitus with diabetic polyneuropathy: Secondary | ICD-10-CM | POA: Diagnosis not present

## 2022-12-28 DIAGNOSIS — K219 Gastro-esophageal reflux disease without esophagitis: Secondary | ICD-10-CM | POA: Diagnosis not present

## 2022-12-28 DIAGNOSIS — M179 Osteoarthritis of knee, unspecified: Secondary | ICD-10-CM | POA: Diagnosis not present

## 2022-12-28 DIAGNOSIS — Z0000 Encounter for general adult medical examination without abnormal findings: Secondary | ICD-10-CM | POA: Diagnosis not present

## 2022-12-28 DIAGNOSIS — E7849 Other hyperlipidemia: Secondary | ICD-10-CM | POA: Diagnosis not present

## 2022-12-28 DIAGNOSIS — M810 Age-related osteoporosis without current pathological fracture: Secondary | ICD-10-CM | POA: Diagnosis not present

## 2022-12-28 DIAGNOSIS — I10 Essential (primary) hypertension: Secondary | ICD-10-CM | POA: Diagnosis not present

## 2023-03-29 DIAGNOSIS — A881 Epidemic vertigo: Secondary | ICD-10-CM | POA: Diagnosis not present

## 2023-03-29 DIAGNOSIS — Z0000 Encounter for general adult medical examination without abnormal findings: Secondary | ICD-10-CM | POA: Diagnosis not present

## 2023-03-29 DIAGNOSIS — I10 Essential (primary) hypertension: Secondary | ICD-10-CM | POA: Diagnosis not present

## 2023-03-29 DIAGNOSIS — M179 Osteoarthritis of knee, unspecified: Secondary | ICD-10-CM | POA: Diagnosis not present

## 2023-03-29 DIAGNOSIS — K219 Gastro-esophageal reflux disease without esophagitis: Secondary | ICD-10-CM | POA: Diagnosis not present

## 2023-03-29 DIAGNOSIS — E7849 Other hyperlipidemia: Secondary | ICD-10-CM | POA: Diagnosis not present

## 2023-03-29 DIAGNOSIS — E1142 Type 2 diabetes mellitus with diabetic polyneuropathy: Secondary | ICD-10-CM | POA: Diagnosis not present

## 2023-03-29 DIAGNOSIS — M810 Age-related osteoporosis without current pathological fracture: Secondary | ICD-10-CM | POA: Diagnosis not present

## 2023-06-28 DIAGNOSIS — E1142 Type 2 diabetes mellitus with diabetic polyneuropathy: Secondary | ICD-10-CM | POA: Diagnosis not present

## 2023-06-28 DIAGNOSIS — I10 Essential (primary) hypertension: Secondary | ICD-10-CM | POA: Diagnosis not present

## 2023-06-28 DIAGNOSIS — K219 Gastro-esophageal reflux disease without esophagitis: Secondary | ICD-10-CM | POA: Diagnosis not present

## 2023-06-28 DIAGNOSIS — N182 Chronic kidney disease, stage 2 (mild): Secondary | ICD-10-CM | POA: Diagnosis not present

## 2023-06-28 DIAGNOSIS — M810 Age-related osteoporosis without current pathological fracture: Secondary | ICD-10-CM | POA: Diagnosis not present

## 2023-06-28 DIAGNOSIS — A881 Epidemic vertigo: Secondary | ICD-10-CM | POA: Diagnosis not present

## 2023-06-28 DIAGNOSIS — E7849 Other hyperlipidemia: Secondary | ICD-10-CM | POA: Diagnosis not present

## 2023-06-28 DIAGNOSIS — M179 Osteoarthritis of knee, unspecified: Secondary | ICD-10-CM | POA: Diagnosis not present

## 2023-07-21 LAB — AMB RESULTS CONSOLE CBG: Glucose: 102

## 2023-08-10 DIAGNOSIS — H43393 Other vitreous opacities, bilateral: Secondary | ICD-10-CM | POA: Diagnosis not present

## 2023-08-11 ENCOUNTER — Encounter: Payer: Self-pay | Admitting: *Deleted

## 2023-08-11 NOTE — Progress Notes (Signed)
Pt attended 07/21/23 screening event where her b/p was 127/80 and her blood sugar was 102. At the event the pt confirmed her PCP is Dr. Lia Hopping at [facility], and she did not identify any SDOH insecurities. Chart review indicates pt has been seeing Dr. Olena Leatherwood since 2021, and her most recent visit was on 03/29/23 (PCP past visits visible in [facility] but not PCP notes or future appt). No additional health equity team support is indicated at this time.

## 2023-09-13 DIAGNOSIS — J40 Bronchitis, not specified as acute or chronic: Secondary | ICD-10-CM | POA: Diagnosis not present

## 2023-09-13 DIAGNOSIS — M179 Osteoarthritis of knee, unspecified: Secondary | ICD-10-CM | POA: Diagnosis not present

## 2023-09-13 DIAGNOSIS — A881 Epidemic vertigo: Secondary | ICD-10-CM | POA: Diagnosis not present

## 2023-09-13 DIAGNOSIS — N182 Chronic kidney disease, stage 2 (mild): Secondary | ICD-10-CM | POA: Diagnosis not present

## 2023-09-13 DIAGNOSIS — M810 Age-related osteoporosis without current pathological fracture: Secondary | ICD-10-CM | POA: Diagnosis not present

## 2023-09-13 DIAGNOSIS — K219 Gastro-esophageal reflux disease without esophagitis: Secondary | ICD-10-CM | POA: Diagnosis not present

## 2023-09-13 DIAGNOSIS — I10 Essential (primary) hypertension: Secondary | ICD-10-CM | POA: Diagnosis not present

## 2023-09-13 DIAGNOSIS — E7849 Other hyperlipidemia: Secondary | ICD-10-CM | POA: Diagnosis not present

## 2023-09-13 DIAGNOSIS — E1142 Type 2 diabetes mellitus with diabetic polyneuropathy: Secondary | ICD-10-CM | POA: Diagnosis not present

## 2023-09-14 DIAGNOSIS — R809 Proteinuria, unspecified: Secondary | ICD-10-CM | POA: Diagnosis not present

## 2023-12-13 DIAGNOSIS — E7849 Other hyperlipidemia: Secondary | ICD-10-CM | POA: Diagnosis not present

## 2023-12-13 DIAGNOSIS — N182 Chronic kidney disease, stage 2 (mild): Secondary | ICD-10-CM | POA: Diagnosis not present

## 2023-12-13 DIAGNOSIS — Z0000 Encounter for general adult medical examination without abnormal findings: Secondary | ICD-10-CM | POA: Diagnosis not present

## 2023-12-13 DIAGNOSIS — H524 Presbyopia: Secondary | ICD-10-CM | POA: Diagnosis not present

## 2023-12-13 DIAGNOSIS — M179 Osteoarthritis of knee, unspecified: Secondary | ICD-10-CM | POA: Diagnosis not present

## 2023-12-13 DIAGNOSIS — E1122 Type 2 diabetes mellitus with diabetic chronic kidney disease: Secondary | ICD-10-CM | POA: Diagnosis not present

## 2023-12-13 DIAGNOSIS — I10 Essential (primary) hypertension: Secondary | ICD-10-CM | POA: Diagnosis not present

## 2023-12-13 DIAGNOSIS — G43911 Migraine, unspecified, intractable, with status migrainosus: Secondary | ICD-10-CM | POA: Diagnosis not present

## 2023-12-13 DIAGNOSIS — A881 Epidemic vertigo: Secondary | ICD-10-CM | POA: Diagnosis not present

## 2023-12-13 DIAGNOSIS — M810 Age-related osteoporosis without current pathological fracture: Secondary | ICD-10-CM | POA: Diagnosis not present

## 2023-12-13 DIAGNOSIS — K219 Gastro-esophageal reflux disease without esophagitis: Secondary | ICD-10-CM | POA: Diagnosis not present

## 2023-12-13 DIAGNOSIS — H43393 Other vitreous opacities, bilateral: Secondary | ICD-10-CM | POA: Diagnosis not present

## 2024-03-13 DIAGNOSIS — E7849 Other hyperlipidemia: Secondary | ICD-10-CM | POA: Diagnosis not present

## 2024-03-13 DIAGNOSIS — Z0000 Encounter for general adult medical examination without abnormal findings: Secondary | ICD-10-CM | POA: Diagnosis not present

## 2024-03-13 DIAGNOSIS — M810 Age-related osteoporosis without current pathological fracture: Secondary | ICD-10-CM | POA: Diagnosis not present

## 2024-03-13 DIAGNOSIS — K219 Gastro-esophageal reflux disease without esophagitis: Secondary | ICD-10-CM | POA: Diagnosis not present

## 2024-03-13 DIAGNOSIS — M179 Osteoarthritis of knee, unspecified: Secondary | ICD-10-CM | POA: Diagnosis not present

## 2024-03-13 DIAGNOSIS — I10 Essential (primary) hypertension: Secondary | ICD-10-CM | POA: Diagnosis not present

## 2024-03-13 DIAGNOSIS — E1122 Type 2 diabetes mellitus with diabetic chronic kidney disease: Secondary | ICD-10-CM | POA: Diagnosis not present

## 2024-03-21 DIAGNOSIS — Z1231 Encounter for screening mammogram for malignant neoplasm of breast: Secondary | ICD-10-CM | POA: Diagnosis not present

## 2024-03-30 DIAGNOSIS — M810 Age-related osteoporosis without current pathological fracture: Secondary | ICD-10-CM | POA: Diagnosis not present

## 2024-03-30 DIAGNOSIS — M8580 Other specified disorders of bone density and structure, unspecified site: Secondary | ICD-10-CM | POA: Diagnosis not present

## 2024-03-30 DIAGNOSIS — Z780 Asymptomatic menopausal state: Secondary | ICD-10-CM | POA: Diagnosis not present

## 2024-06-19 DIAGNOSIS — H814 Vertigo of central origin: Secondary | ICD-10-CM | POA: Diagnosis not present

## 2024-06-19 DIAGNOSIS — E7849 Other hyperlipidemia: Secondary | ICD-10-CM | POA: Diagnosis not present

## 2024-06-19 DIAGNOSIS — N182 Chronic kidney disease, stage 2 (mild): Secondary | ICD-10-CM | POA: Diagnosis not present

## 2024-06-19 DIAGNOSIS — G43911 Migraine, unspecified, intractable, with status migrainosus: Secondary | ICD-10-CM | POA: Diagnosis not present

## 2024-06-19 DIAGNOSIS — K219 Gastro-esophageal reflux disease without esophagitis: Secondary | ICD-10-CM | POA: Diagnosis not present

## 2024-06-19 DIAGNOSIS — M179 Osteoarthritis of knee, unspecified: Secondary | ICD-10-CM | POA: Diagnosis not present

## 2024-06-19 DIAGNOSIS — I10 Essential (primary) hypertension: Secondary | ICD-10-CM | POA: Diagnosis not present

## 2024-06-19 DIAGNOSIS — M810 Age-related osteoporosis without current pathological fracture: Secondary | ICD-10-CM | POA: Diagnosis not present

## 2024-06-19 DIAGNOSIS — E1122 Type 2 diabetes mellitus with diabetic chronic kidney disease: Secondary | ICD-10-CM | POA: Diagnosis not present

## 2024-07-12 DIAGNOSIS — H43391 Other vitreous opacities, right eye: Secondary | ICD-10-CM | POA: Diagnosis not present

## 2024-08-07 DIAGNOSIS — I10 Essential (primary) hypertension: Secondary | ICD-10-CM | POA: Diagnosis not present

## 2024-08-07 DIAGNOSIS — N182 Chronic kidney disease, stage 2 (mild): Secondary | ICD-10-CM | POA: Diagnosis not present

## 2024-08-16 NOTE — Progress Notes (Signed)
 Vanessa Pierce                                          MRN: [id number]   08/16/2024   The [facility] Quality Team Specialist reviewed this patient medical record for the purposes of chart review for care gap closure. The following were reviewed: chart review for care gap closure-kidney health evaluation for diabetes:eGFR  and uACR.    [facility] Quality Team

## 2024-09-06 NOTE — Progress Notes (Signed)
 Vanessa Pierce                                          MRN: [id number]   09/06/2024   The [facility] Quality Team Specialist reviewed this patient medical record for the purposes of chart review for care gap closure. The following were reviewed: chart review for care gap closure-glycemic status assessment.    [facility] Quality Team
